# Patient Record
Sex: Female | Born: 1935 | Race: White | Hispanic: No | Smoking: Former smoker
Health system: Southern US, Community
[De-identification: ages and names within clinical notes are randomized; demographics above are authoritative.]

## PROBLEM LIST (undated history)

## (undated) DIAGNOSIS — I10 Essential (primary) hypertension: Secondary | ICD-10-CM

## (undated) DIAGNOSIS — E785 Hyperlipidemia, unspecified: Secondary | ICD-10-CM

## (undated) DIAGNOSIS — J439 Emphysema, unspecified: Secondary | ICD-10-CM

## (undated) DIAGNOSIS — I2510 Atherosclerotic heart disease of native coronary artery without angina pectoris: Secondary | ICD-10-CM

## (undated) HISTORY — DX: Essential (primary) hypertension: I10

## (undated) HISTORY — DX: Emphysema, unspecified: J43.9

## (undated) HISTORY — DX: Hyperlipidemia, unspecified: E78.5

## (undated) HISTORY — PX: CARDIAC CATHETERIZATION: SHX172

## (undated) HISTORY — DX: Atherosclerotic heart disease of native coronary artery without angina pectoris: I25.10

---

## 1982-09-01 HISTORY — PX: TOTAL ABDOMINAL HYSTERECTOMY W/ BILATERAL SALPINGOOPHORECTOMY: SHX83

## 2014-09-01 DIAGNOSIS — I219 Acute myocardial infarction, unspecified: Secondary | ICD-10-CM

## 2014-09-01 HISTORY — DX: Acute myocardial infarction, unspecified: I21.9

## 2020-05-24 ENCOUNTER — Telehealth: Payer: Self-pay | Admitting: Cardiovascular Disease

## 2020-05-24 NOTE — Telephone Encounter (Signed)
Encounter not needed

## 2020-05-28 ENCOUNTER — Telehealth: Payer: Self-pay | Admitting: Hematology and Oncology

## 2020-05-28 NOTE — Telephone Encounter (Signed)
Received a new hem referral from Dr. Jacalyn Lefevre at [facility] for polycythemia vera. Melissa Barron returned my call and has been scheduled to see Melissa Barron on 10/13 at 11:30am. Pt aware to arrive 20 minutes early.

## 2020-06-06 ENCOUNTER — Telehealth: Payer: Self-pay | Admitting: Hematology and Oncology

## 2020-06-06 NOTE — Telephone Encounter (Signed)
Melissa Barron cld to reschedule her new hem appt to 11/5 at 11:15am.

## 2020-06-08 ENCOUNTER — Ambulatory Visit: Payer: Self-pay | Admitting: Cardiovascular Disease

## 2020-06-13 ENCOUNTER — Encounter: Payer: Self-pay | Admitting: Hematology and Oncology

## 2020-06-15 ENCOUNTER — Ambulatory Visit: Payer: Self-pay | Admitting: Cardiology

## 2020-06-18 ENCOUNTER — Telehealth: Payer: Self-pay

## 2020-06-18 ENCOUNTER — Other Ambulatory Visit: Payer: Self-pay

## 2020-06-18 ENCOUNTER — Encounter: Payer: Self-pay | Admitting: Cardiology

## 2020-06-18 ENCOUNTER — Ambulatory Visit: Payer: Medicare HMO | Admitting: Cardiology

## 2020-06-18 VITALS — BP 135/59 | HR 57 | Resp 18 | Ht 60.0 in | Wt 124.0 lb

## 2020-06-18 DIAGNOSIS — I10 Essential (primary) hypertension: Secondary | ICD-10-CM | POA: Insufficient documentation

## 2020-06-18 DIAGNOSIS — Z8249 Family history of ischemic heart disease and other diseases of the circulatory system: Secondary | ICD-10-CM | POA: Insufficient documentation

## 2020-06-18 DIAGNOSIS — R0989 Other specified symptoms and signs involving the circulatory and respiratory systems: Secondary | ICD-10-CM

## 2020-06-18 DIAGNOSIS — I2510 Atherosclerotic heart disease of native coronary artery without angina pectoris: Secondary | ICD-10-CM

## 2020-06-18 MED ORDER — PANTOPRAZOLE SODIUM 40 MG PO TBEC: 40.0000 mg | DELAYED_RELEASE_TABLET | Freq: Every day | ORAL | 3 refills | Status: DC

## 2020-06-18 MED ORDER — CARVEDILOL 6.25 MG PO TABS: 6.2500 mg | ORAL_TABLET | Freq: Two times a day (BID) | ORAL | 3 refills | Status: DC

## 2020-06-18 MED ORDER — AMLODIPINE BESYLATE 5 MG PO TABS: 5.0000 mg | ORAL_TABLET | Freq: Every day | ORAL | 3 refills | Status: DC

## 2020-06-18 MED ORDER — ISOSORBIDE MONONITRATE ER 30 MG PO TB24: 30.0000 mg | ORAL_TABLET | Freq: Every day | ORAL | 3 refills | Status: DC

## 2020-06-18 MED ORDER — ASPIRIN EC 81 MG PO TBEC: 81.0000 mg | DELAYED_RELEASE_TABLET | Freq: Every day | ORAL | 3 refills | Status: AC

## 2020-06-18 MED ORDER — IRBESARTAN 300 MG PO TABS: 300.0000 mg | ORAL_TABLET | Freq: Every day | ORAL | 3 refills | Status: AC

## 2020-06-18 NOTE — Telephone Encounter (Signed)
Pt son called to inform us the pt does not have family history of AAA, and thinks that you might be confusing him with pt husband. Who was here a last week. Please advise

## 2020-06-18 NOTE — Progress Notes (Signed)
Your patient.

## 2020-06-18 NOTE — Telephone Encounter (Signed)
I am aware of husband's history, but I saw on the family history tab re: h/o AAA in mother. Probably entered wrong. Cancel aorta duplex.   Thanks MJP

## 2020-06-18 NOTE — Addendum Note (Signed)
Addended by: Nigel Mormon on: 06/18/2020 01:54 PM   Modules accepted: Orders, Level of Service

## 2020-06-18 NOTE — Progress Notes (Addendum)
Patient referred by [name], MD for coronary artery disease  Subjective:   Melissa Barron, female    DOB: 26-Jul-1931, 86 y.o.   MRN: [id number]   Chief Complaint  Patient presents with  . Coronary Artery Disease  . New Patient (Initial Visit)    Referred by Cristie Hem, MD     HPI  86 y.o. Caucasian female with hypertension, hyperlipidemia, CAD w/h/o MI (2018), COPD, depression, GERD.  Patient is here today with her son.  She moved from [place] to [place] recently.  She has history of MI and stents in 2016.  At that time, her symptoms were arm pain, shoulder pain, jaw pain and tingling in fingers.  She has not had the symptoms ever since successful PCI in 2016.  She has stable mild exertional dyspnea.  She has known COPD, and has had regular follow-up with pulmonologist in [place].  On a separate note, she has been on hydroxyurea for hematological condition, details not known to me.  MCV is noted to be elevated at 106.   Past Medical History:  Diagnosis Date  . Coronary artery disease   . Heart attack ([place]) 2016  . Hyperlipidemia   . Hypertension      Past Surgical History:  Procedure Laterality Date  . TOTAL ABDOMINAL HYSTERECTOMY W/ BILATERAL SALPINGOOPHORECTOMY  1984     Social History   Tobacco Use  Smoking Status Former Smoker  . Packs/day: 1.50  . Years: 30.00  . Pack years: 45.00  . Types: Cigarettes  . Quit date: 54  . Years since quitting: 41.8  Smokeless Tobacco Never Used    Social History   Substance and Sexual Activity  Alcohol Use Yes  . Alcohol/week: 1.0 standard drink  . Types: 1 Glasses of wine per week   Comment: 7 days a week/1 a day     Family History  Problem Relation Age of Onset  . AAA (abdominal aortic aneurysm) Mother   . Heart attack Mother   . Colon cancer Brother   . Parkinson's disease Sister   . Emphysema Sister      Current Outpatient Medications on File Prior to Visit  Medication Sig Dispense  Refill  . amLODipine (NORVASC) 5 MG tablet Take 1 tablet by mouth daily.    Marland Kitchen aspirin EC 81 MG tablet Take 81 mg by mouth daily. Swallow whole.    Marland Kitchen atorvastatin (LIPITOR) 80 MG tablet Take 1 tablet by mouth daily.    . carvedilol (COREG) 6.25 MG tablet Take 1 tablet by mouth 2 (two) times daily.    . ergocalciferol (VITAMIN D2) 1.25 MG (50000 UT) capsule Take 50,000 Units by mouth once a week.    . hydroxyurea (HYDREA) 500 MG capsule Take 1 capsule by mouth daily.    . irbesartan (AVAPRO) 300 MG tablet Take 1 tablet by mouth at bedtime.    . isosorbide mononitrate (IMDUR) 30 MG 24 hr tablet Take 1 tablet by mouth daily.    . pantoprazole (PROTONIX) 40 MG tablet Take 1 tablet by mouth daily.    . sertraline (ZOLOFT) 100 MG tablet Take 1 tablet by mouth daily.     No current facility-administered medications on file prior to visit.    Cardiovascular and other pertinent studies:  EKG 06/18/2020:  Sinus rhythm 61 bpm.   First-degree AV block.   Cannot exclude old septal infarct.   Poor R wave progression.     Recent labs: 05/12/2020: Glucose 80, BUN/Cr 17/0.7. EGFR  79. Na/K 137/4.1. Rest of the CMP normal H/H 14/44. MCV 106. Platelets 280 HbA1C N/A Chol 124, TG 106, HDL 42, LDL 61 TSH N/A    ROS       Vitals:   06/18/20 1048  BP: (!) 135/59  Pulse: (!) 57  Resp: 18  SpO2: 92%     Body mass index is 24.22 kg/m. Filed Weights   06/18/20 1048  Weight: 124 lb (56.2 kg)     Objective:   Physical Exam Vitals and nursing note reviewed.  Constitutional:      General: She is not in acute distress. Neck:     Vascular: No JVD.  Cardiovascular:     Rate and Rhythm: Normal rate and regular rhythm.     Pulses:          Carotid pulses are on the right side with bruit.    Heart sounds: Murmur heard.  Harsh midsystolic murmur is present with a grade of 3/6 at the upper right sternal border radiating to the neck.   Pulmonary:     Effort: Pulmonary effort is normal.      Breath sounds: Normal breath sounds. No wheezing or rales.         Assessment & Recommendations:   86 y.o. Caucasian female with hypertension, hyperlipidemia, CAD w/h/o MI (2018), COPD, depression, GERD.  CAD: History of MI and stents in 2016.  No angina symptoms. Continue aspirin, statin, carvedilol, amlodipine. Lipids well controlled.  Hypertension: Well-controlled on above medications, plus irbesartan.  Murmur: Suspect mild aortic stenosis.  Patient tells me that she was told to have mitral valve calcification.  Will obtain echocardiogram, and compared with previous echocardiograms performed in [place].  Carotid bruit: No symptoms.  Will obtain carotid ultrasound for baseline evaluation.   Follow-up in 1 year.   Thank you for referring the patient to [place]. Please feel free to contact with any questions.   Nigel Mormon, MD Pager: [phone] Office: [phone]

## 2020-06-21 ENCOUNTER — Other Ambulatory Visit: Payer: Medicare HMO

## 2020-06-26 ENCOUNTER — Ambulatory Visit: Payer: Medicare HMO

## 2020-06-26 ENCOUNTER — Other Ambulatory Visit: Payer: Self-pay

## 2020-06-26 DIAGNOSIS — I2510 Atherosclerotic heart disease of native coronary artery without angina pectoris: Secondary | ICD-10-CM

## 2020-06-26 DIAGNOSIS — I6523 Occlusion and stenosis of bilateral carotid arteries: Secondary | ICD-10-CM

## 2020-06-26 DIAGNOSIS — R0989 Other specified symptoms and signs involving the circulatory and respiratory systems: Secondary | ICD-10-CM

## 2020-06-27 NOTE — Progress Notes (Signed)
Unable to reach patient. Left vm to cb.

## 2020-06-28 ENCOUNTER — Telehealth: Payer: Self-pay

## 2020-06-28 NOTE — Telephone Encounter (Signed)
Left detailed message regarding results and medication therapy. Advised patient to call office for any questions or concerns.

## 2020-06-28 NOTE — Telephone Encounter (Signed)
-----  Message from Elyn Peers, [place] sent at 06/27/2020  1:51 PM EDT ----- Unable to reach patient. Left vm to cb.

## 2020-07-05 NOTE — Progress Notes (Signed)
[facility] CONSULT NOTE  Patient Care Team: [name], MD as PCP - General (Internal Medicine)  CHIEF COMPLAINTS/PURPOSE OF CONSULTATION:  History of polycythemia vera   HISTORY OF PRESENTING ILLNESS:  Melissa Barron 86 y.o. female is here because of a history of polycythemia vera for which she is currently on hydroxyurea. She is referred by Dr. Jacalyn Lefevre at [facility] on 05/11/20 showed: Hg 14.3, HCT 44.4, MCV 106.8, MCH 34.5, platelets 260. She presents to the clinic today for initial evaluation. She was diagnosed with polycythemia vera about 6 years ago. She was on hydroxyurea 5 days a week and then recently increased to 7 days a week. She was being seen at [facility] every 6 months with labs. She has not had any side effects to hydroxyurea. She moved to [facility] to be closer to her son.  I reviewed her records extensively and collaborated the history with the patient.  MEDICAL HISTORY:  Past Medical History:  Diagnosis Date   Coronary artery disease    Emphysema, unspecified ([place])    Both   Heart attack ([place]) 2016   Hyperlipidemia    Hypertension     SURGICAL HISTORY: Past Surgical History:  Procedure Laterality Date   TOTAL ABDOMINAL HYSTERECTOMY W/ BILATERAL SALPINGOOPHORECTOMY  1984    SOCIAL HISTORY: Social History   Socioeconomic History   Marital status: Married    Spouse name: Not on file   Number of children: 4   Years of education: Not on file   Highest education level: Not on file  Occupational History   Not on file  Tobacco Use   Smoking status: Former Smoker    Packs/day: 1.50    Years: 30.00    Pack years: 45.00    Types: Cigarettes    Quit date: 1980    Years since quitting: 41.8   Smokeless tobacco: Never Used  Scientific laboratory technician Use: Never used  Substance and Sexual Activity   Alcohol use: Yes    Alcohol/week: 1.0 standard drink    Types: 1 Glasses of wine per week    Comment: 7 days a week/1 a day    Drug use: Never   Sexual activity: Not on file  Other Topics Concern   Not on file  Social History Narrative   Not on file   Social Determinants of Health   Financial Resource Strain:    Difficulty of Paying Living Expenses: Not on file  Food Insecurity:    Worried About [place] in the Last Year: Not on file   Ran Out of Food in the Last Year: Not on file  Transportation Needs:    Lack of Transportation (Medical): Not on file   Lack of Transportation (Non-Medical): Not on file  Physical Activity:    Days of Exercise per Week: Not on file   Minutes of Exercise per Session: Not on file  Stress:    Feeling of Stress : Not on file  Social Connections:    Frequency of Communication with Friends and Family: Not on file   Frequency of Social Gatherings with Friends and Family: Not on file   Attends Religious Services: Not on file   Active Member of Clubs or Organizations: Not on file   Attends Archivist Meetings: Not on file   Marital Status: Not on file  Intimate Partner Violence:    Fear of Current or Ex-Partner: Not on file   Emotionally Abused: Not on file  Physically Abused: Not on file   Sexually Abused: Not on file    FAMILY HISTORY: Family History  Problem Relation Age of Onset   Heart attack Mother    Colon cancer Brother    Parkinson's disease Sister    Emphysema Sister     ALLERGIES:  has No Known Allergies.  MEDICATIONS:  Current Outpatient Medications  Medication Sig Dispense Refill   amLODipine (NORVASC) 5 MG tablet Take 1 tablet (5 mg total) by mouth daily. 90 tablet 3   aspirin EC 81 MG tablet Take 1 tablet (81 mg total) by mouth daily. Swallow whole. 90 tablet 3   atorvastatin (LIPITOR) 80 MG tablet Take 1 tablet by mouth daily.     carvedilol (COREG) 6.25 MG tablet Take 1 tablet (6.25 mg total) by mouth 2 (two) times daily. 180 tablet 3   Cholecalciferol (VITAMIN D3) 250 MCG (10000 UT) capsule  Take 1 capsule (10,000 Units total) by mouth daily.     hydroxyurea (HYDREA) 500 MG capsule Take 1 capsule (500 mg total) by mouth daily. 90 capsule 3   irbesartan (AVAPRO) 300 MG tablet Take 1 tablet (300 mg total) by mouth at bedtime. 90 tablet 3   isosorbide mononitrate (IMDUR) 30 MG 24 hr tablet Take 1 tablet (30 mg total) by mouth daily. 90 tablet 3   pantoprazole (PROTONIX) 40 MG tablet Take 1 tablet (40 mg total) by mouth daily. 90 tablet 3   sertraline (ZOLOFT) 100 MG tablet Take 1 tablet by mouth daily.     No current facility-administered medications for this visit.    REVIEW OF SYSTEMS:      PHYSICAL EXAMINATION: ECOG PERFORMANCE STATUS: 1 - Symptomatic but completely ambulatory  Vitals:   07/06/20 1157  BP: (!) 145/53  Pulse: 66  Resp: 18  Temp: (!) 97.1 F (36.2 C)  SpO2: 90%   Filed Weights   07/06/20 1157  Weight: 122 lb 1.6 oz (55.4 kg)       LABORATORY DATA:  I have reviewed the data as listed No results found for: WBC, HGB, HCT, MCV, PLT No results found for: NA, K, CL, CO2  RADIOGRAPHIC STUDIES: I have personally reviewed the radiological reports and agreed with the findings in the report.  ASSESSMENT AND PLAN:  Polycythemia vera ([place]) Diagnosed at [facility] Current treatment: Hydroxyurea 500 mg daily History of COPD, depression, hypertension, hypercholesterolemia, osteoporosis, GERD, CAD Hydroxyurea toxicities: None: She is tolerating it extremely well.  Lab review: 05/11/2020: Hemoglobin 14.3, WBC 7.9, MCV 106.8, platelets 260 The macrocytosis indicates that the patient is compliant on hydroxyurea.  I agree with continuing the current treatment. Return to clinic every 6 months with labs and follow-up.   All questions were answered. The patient knows to call the clinic with any problems, questions or concerns.   Rulon Eisenmenger, MD, MPH 07/06/2020    I, Molly Dorshimer, am acting as scribe for Nicholas Lose, MD.  I have reviewed the  above documentation for accuracy and completeness, and I agree with the above.

## 2020-07-06 ENCOUNTER — Other Ambulatory Visit: Payer: Self-pay | Admitting: Cardiology

## 2020-07-06 ENCOUNTER — Other Ambulatory Visit: Payer: Self-pay

## 2020-07-06 ENCOUNTER — Inpatient Hospital Stay: Payer: Medicare HMO | Attending: Hematology and Oncology | Admitting: Hematology and Oncology

## 2020-07-06 DIAGNOSIS — D45 Polycythemia vera: Secondary | ICD-10-CM

## 2020-07-06 DIAGNOSIS — I351 Nonrheumatic aortic (valve) insufficiency: Secondary | ICD-10-CM

## 2020-07-06 DIAGNOSIS — I2510 Atherosclerotic heart disease of native coronary artery without angina pectoris: Secondary | ICD-10-CM | POA: Insufficient documentation

## 2020-07-06 DIAGNOSIS — M810 Age-related osteoporosis without current pathological fracture: Secondary | ICD-10-CM | POA: Diagnosis not present

## 2020-07-06 DIAGNOSIS — F329 Major depressive disorder, single episode, unspecified: Secondary | ICD-10-CM | POA: Diagnosis not present

## 2020-07-06 DIAGNOSIS — K219 Gastro-esophageal reflux disease without esophagitis: Secondary | ICD-10-CM | POA: Insufficient documentation

## 2020-07-06 DIAGNOSIS — I361 Nonrheumatic tricuspid (valve) insufficiency: Secondary | ICD-10-CM

## 2020-07-06 DIAGNOSIS — J439 Emphysema, unspecified: Secondary | ICD-10-CM | POA: Diagnosis not present

## 2020-07-06 DIAGNOSIS — Z87891 Personal history of nicotine dependence: Secondary | ICD-10-CM | POA: Insufficient documentation

## 2020-07-06 DIAGNOSIS — I252 Old myocardial infarction: Secondary | ICD-10-CM | POA: Insufficient documentation

## 2020-07-06 DIAGNOSIS — E785 Hyperlipidemia, unspecified: Secondary | ICD-10-CM | POA: Insufficient documentation

## 2020-07-06 DIAGNOSIS — Z79899 Other long term (current) drug therapy: Secondary | ICD-10-CM | POA: Diagnosis not present

## 2020-07-06 DIAGNOSIS — I350 Nonrheumatic aortic (valve) stenosis: Secondary | ICD-10-CM

## 2020-07-06 DIAGNOSIS — E7800 Pure hypercholesterolemia, unspecified: Secondary | ICD-10-CM | POA: Diagnosis not present

## 2020-07-06 DIAGNOSIS — Z7982 Long term (current) use of aspirin: Secondary | ICD-10-CM | POA: Diagnosis not present

## 2020-07-06 DIAGNOSIS — I340 Nonrheumatic mitral (valve) insufficiency: Secondary | ICD-10-CM

## 2020-07-06 DIAGNOSIS — Z800 Family history of malignant neoplasm of digestive organs: Secondary | ICD-10-CM | POA: Diagnosis not present

## 2020-07-06 DIAGNOSIS — I10 Essential (primary) hypertension: Secondary | ICD-10-CM | POA: Diagnosis not present

## 2020-07-06 MED ORDER — HYDROXYUREA 500 MG PO CAPS
500.0000 mg | ORAL_CAPSULE | Freq: Every day | ORAL | 3 refills | Status: DC
Start: 2020-07-06 — End: 2021-01-03

## 2020-07-06 MED ORDER — VITAMIN D3 250 MCG (10000 UT) PO CAPS: 10000.0000 [IU] | ORAL_CAPSULE | Freq: Every day | ORAL | Status: AC

## 2020-07-06 NOTE — Assessment & Plan Note (Signed)
Diagnosed at [facility] Current treatment: Hydroxyurea 500 mg daily History of COPD, depression, hypertension, hypercholesterolemia, osteoporosis, GERD, CAD Hydroxyurea toxicities:  Lab review: 05/11/2020: Hemoglobin 14.3, WBC 7.9, MCV 106.8, platelets 260 The macrocytosis indicates that the patient is compliant on hydroxyurea.  I agree with continuing the current treatment. Return to clinic every 3 months with labs and follow-up.

## 2020-07-13 NOTE — Progress Notes (Signed)
Called pt no answer. Left vm requesting call back. Will try again later. AD/S

## 2020-07-16 NOTE — Progress Notes (Signed)
Pt called back, spoke to her about echocardiogram results. AD/S

## 2020-07-16 NOTE — Progress Notes (Signed)
Called pt no answer. Left vm requesting call back. AD/S

## 2020-09-10 ENCOUNTER — Other Ambulatory Visit (HOSPITAL_COMMUNITY): Payer: Self-pay | Admitting: *Deleted

## 2020-09-12 ENCOUNTER — Other Ambulatory Visit: Payer: Self-pay

## 2020-09-12 ENCOUNTER — Ambulatory Visit (HOSPITAL_COMMUNITY)
Admission: RE | Admit: 2020-09-12 | Discharge: 2020-09-12 | Disposition: A | Discharge status: Home / Self Care | Payer: Medicare HMO | Source: Ambulatory Visit | Attending: Internal Medicine | Admitting: Internal Medicine

## 2020-09-12 DIAGNOSIS — M810 Age-related osteoporosis without current pathological fracture: Secondary | ICD-10-CM | POA: Diagnosis present

## 2020-09-12 MED ORDER — DENOSUMAB 60 MG/ML ~~LOC~~ SOSY
PREFILLED_SYRINGE | SUBCUTANEOUS | Status: AC
  Filled 2020-09-12: qty 1

## 2020-09-12 MED ORDER — DENOSUMAB 60 MG/ML ~~LOC~~ SOSY
60.0000 mg | PREFILLED_SYRINGE | Freq: Once | SUBCUTANEOUS | Status: AC
  Administered 2020-09-12: 60 mg via SUBCUTANEOUS

## 2020-09-12 NOTE — Discharge Instructions (Signed)
Denosumab injection °What is this medicine? °DENOSUMAB (den oh sue mab) slows bone breakdown. Prolia is used to treat osteoporosis in women after menopause and in men, and in people who are taking corticosteroids for 6 months or more. Xgeva is used to treat a high calcium level due to cancer and to prevent bone fractures and other bone problems caused by multiple myeloma or cancer bone metastases. Xgeva is also used to treat giant cell tumor of the bone. °This medicine may be used for other purposes; ask your health care provider or pharmacist if you have questions. °COMMON BRAND NAME(S): Prolia, XGEVA °What should I tell my health care provider before I take this medicine? °They need to know if you have any of these conditions: °· dental disease °· having surgery or tooth extraction °· infection °· kidney disease °· low levels of calcium or Vitamin D in the blood °· malnutrition °· on hemodialysis °· skin conditions or sensitivity °· thyroid or parathyroid disease °· an unusual reaction to denosumab, other medicines, foods, dyes, or preservatives °· pregnant or trying to get pregnant °· breast-feeding °How should I use this medicine? °This medicine is for injection under the skin. It is given by a health care professional in a hospital or clinic setting. °A special MedGuide will be given to you before each treatment. Be sure to read this information carefully each time. °For Prolia, talk to your pediatrician regarding the use of this medicine in children. Special care may be needed. For Xgeva, talk to your pediatrician regarding the use of this medicine in children. While this drug may be prescribed for children as young as 13 years for selected conditions, precautions do apply. °Overdosage: If you think you have taken too much of this medicine contact a poison control center or emergency room at once. °NOTE: This medicine is only for you. Do not share this medicine with others. °What if I miss a dose? °It is  important not to miss your dose. Call your doctor or health care professional if you are unable to keep an appointment. °What may interact with this medicine? °Do not take this medicine with any of the following medications: °· other medicines containing denosumab °This medicine may also interact with the following medications: °· medicines that lower your chance of fighting infection °· steroid medicines like prednisone or cortisone °This list may not describe all possible interactions. Give your health care provider a list of all the medicines, herbs, non-prescription drugs, or dietary supplements you use. Also tell them if you smoke, drink alcohol, or use illegal drugs. Some items may interact with your medicine. °What should I watch for while using this medicine? °Visit your doctor or health care professional for regular checks on your progress. Your doctor or health care professional may order blood tests and other tests to see how you are doing. °Call your doctor or health care professional for advice if you get a fever, chills or sore throat, or other symptoms of a cold or flu. Do not treat yourself. This drug may decrease your body's ability to fight infection. Try to avoid being around people who are sick. °You should make sure you get enough calcium and vitamin D while you are taking this medicine, unless your doctor tells you not to. Discuss the foods you eat and the vitamins you take with your health care professional. °See your dentist regularly. Brush and floss your teeth as directed. Before you have any dental work done, tell your dentist you are   receiving this medicine. Do not become pregnant while taking this medicine or for 5 months after stopping it. Talk with your doctor or health care professional about your birth control options while taking this medicine. Women should inform their doctor if they wish to become pregnant or think they might be pregnant. There is a potential for serious side  effects to an unborn child. Talk to your health care professional or pharmacist for more information. What side effects may I notice from receiving this medicine? Side effects that you should report to your doctor or health care professional as soon as possible:  allergic reactions like skin rash, itching or hives, swelling of the face, lips, or tongue  bone pain  breathing problems  dizziness  jaw pain, especially after dental work  redness, blistering, peeling of the skin  signs and symptoms of infection like fever or chills; cough; sore throat; pain or trouble passing urine  signs of low calcium like fast heartbeat, muscle cramps or muscle pain; pain, tingling, numbness in the hands or feet; seizures  unusual bleeding or bruising  unusually weak or tired Side effects that usually do not require medical attention (report to your doctor or health care professional if they continue or are bothersome):  constipation  diarrhea  headache  joint pain  loss of appetite  muscle pain  runny nose  tiredness  upset stomach This list may not describe all possible side effects. Call your doctor for medical advice about side effects. You may report side effects to FDA at 1-800-FDA-1088. Where should I keep my medicine? This medicine is only given in a clinic, doctor's office, or other health care setting and will not be stored at home. NOTE: This sheet is a summary. It may not cover all possible information. If you have questions about this medicine, talk to your doctor, pharmacist, or health care provider.  2021 Elsevier/Gold Standard (2017-12-25 16:10:44)

## 2020-11-01 DIAGNOSIS — M810 Age-related osteoporosis without current pathological fracture: Secondary | ICD-10-CM | POA: Diagnosis not present

## 2020-11-01 DIAGNOSIS — E7800 Pure hypercholesterolemia, unspecified: Secondary | ICD-10-CM | POA: Diagnosis not present

## 2020-11-08 DIAGNOSIS — Z1331 Encounter for screening for depression: Secondary | ICD-10-CM | POA: Diagnosis not present

## 2020-11-08 DIAGNOSIS — I110 Hypertensive heart disease with heart failure: Secondary | ICD-10-CM | POA: Diagnosis not present

## 2020-11-08 DIAGNOSIS — Z0000 Encounter for general adult medical examination without abnormal findings: Secondary | ICD-10-CM | POA: Diagnosis not present

## 2020-11-08 DIAGNOSIS — F418 Other specified anxiety disorders: Secondary | ICD-10-CM | POA: Diagnosis not present

## 2020-11-08 DIAGNOSIS — J9611 Chronic respiratory failure with hypoxia: Secondary | ICD-10-CM | POA: Diagnosis not present

## 2020-11-08 DIAGNOSIS — D45 Polycythemia vera: Secondary | ICD-10-CM | POA: Diagnosis not present

## 2020-11-08 DIAGNOSIS — Z1339 Encounter for screening examination for other mental health and behavioral disorders: Secondary | ICD-10-CM | POA: Diagnosis not present

## 2020-11-08 DIAGNOSIS — J449 Chronic obstructive pulmonary disease, unspecified: Secondary | ICD-10-CM | POA: Diagnosis not present

## 2020-11-08 DIAGNOSIS — I272 Pulmonary hypertension, unspecified: Secondary | ICD-10-CM | POA: Diagnosis not present

## 2020-11-08 DIAGNOSIS — I2510 Atherosclerotic heart disease of native coronary artery without angina pectoris: Secondary | ICD-10-CM | POA: Diagnosis not present

## 2020-11-08 DIAGNOSIS — I5032 Chronic diastolic (congestive) heart failure: Secondary | ICD-10-CM | POA: Diagnosis not present

## 2020-11-11 DIAGNOSIS — J449 Chronic obstructive pulmonary disease, unspecified: Secondary | ICD-10-CM | POA: Diagnosis not present

## 2020-11-20 DIAGNOSIS — J449 Chronic obstructive pulmonary disease, unspecified: Secondary | ICD-10-CM | POA: Diagnosis not present

## 2020-12-04 ENCOUNTER — Other Ambulatory Visit: Payer: Self-pay

## 2020-12-04 ENCOUNTER — Ambulatory Visit: Payer: Medicare HMO | Admitting: Pulmonary Disease

## 2020-12-04 ENCOUNTER — Encounter: Payer: Self-pay | Admitting: Pulmonary Disease

## 2020-12-04 VITALS — BP 116/70 | HR 74 | Temp 98.1°F | Ht 60.0 in | Wt 118.0 lb

## 2020-12-04 DIAGNOSIS — J9611 Chronic respiratory failure with hypoxia: Secondary | ICD-10-CM

## 2020-12-04 DIAGNOSIS — R0600 Dyspnea, unspecified: Secondary | ICD-10-CM

## 2020-12-04 DIAGNOSIS — R0609 Other forms of dyspnea: Secondary | ICD-10-CM

## 2020-12-04 NOTE — Progress Notes (Signed)
_0  ID: Melissa Barron, female    DOB: 12/05/1930, 87 y.o.   MRN: [id number]  Chief Complaint  Patient presents with  . Consult    Referred by PCP for history for COPD. States she has noticed an increase in SOB for the past few months. Was last seen by Dr. Ashok Cordia in [place].     Referring provider: [name], MD  HPI:   87 year old whom we are seeing in consultation for evaluation of chronic hypoxemic respiratory failure.  Most recent PCP note reviewed.  Pulmonary note x2 reviewed from [facility] system, most recently 09/2019.  Patient recently moved from [place] to the [place] area to be closer to her son.  He is retired.  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  Overall, she has chronic dyspnea.  Has been relatively stable for some years but now worse over the last 2 or 3 months.  This in the setting of not using her Spiriva.  Sound like he got the donut hole and got expensive.  She stopped using it for some time.  She recently got a new prescription of this the last week or 2 to start using again.  Her dyspnea gets better with rest.  Worse on inclines or stairs.  No clear changes in symptoms with seasons.  No change in symptoms with environment as she moved to the area and had been in her current living situation for 2 or 3 months prior to onset of worsening symptoms.  No time during day were things are better or worse.  In the past [facility] has been very helpful.  No other alleviating or exacerbating factors.  Reviewed prior images which seem to indicate emphysema on CT scan.  There is some discussion of mild fibrosis that the patient attributes to prior pneumonias.  Not much discussion in prior pulmonary notes.  Reviewed serial PFTs which showed normal spirometry with markedly reduced or severely reduced DLCO in the 40-50% range on serial readings out of portion to spirometry findings.   PMH: Chronic hypoxemic respiratory failure, aortic regurg, aortic  stenosis, mitral regurg, polycythemia vera, GERD, hypertension Surgical history: Hysterectomy Family history: Mother with CAD, sister with emphysema Social history: Former smoker, 40-50-pack-year smoking history, lives with husband   Licensed conveyancer / Pulmonary Flowsheets:   ACT:  No flowsheet data found.  MMRC: mMRC Dyspnea Scale mMRC Score  12/04/2020 2    [place]:  No flowsheet data found.  Tests:   FENO:  No results found for: NITRICOXIDE  PFT: Personally reviewed results from 2019 through 09/2019 all with similar results that demonstrate on my interpretation normal spirometry without fixed obstruction, severely reduced DLCO.  WALK:  SIX MIN WALK 12/04/2020  Supplimental Oxygen during Test? (L/min) Yes  O2 Flow Rate 2    Imaging: Personally reviewed results of this discussion above  Lab Results: Personally reviewed and as per EMR via telehealth and care everywhere CBC No results found for: WBC, RBC, HGB, HCT, PLT, MCV, MCH, MCHC, RDW, LYMPHSABS, MONOABS, EOSABS, BASOSABS  BMET No results found for: NA, K, CL, CO2, GLUCOSE, BUN, CREATININE, CALCIUM, GFRNONAA, GFRAA  BNP No results found for: BNP  ProBNP No results found for: PROBNP  Specialty Problems   None     No Known Allergies  Immunization History  Administered Date(s) Administered  . Influenza, Quadrivalent, Recombinant, Inj, Pf 05/11/2020  . PFIZER(Purple Top)SARS-COV-2 Vaccination 09/18/2019, 10/09/2019, 07/27/2020  . Pneumococcal Conjugate-13 06/01/2015    Past Medical History:  Diagnosis  Date  . Coronary artery disease   . Emphysema, unspecified ([place])    Both  . Heart attack ([place]) 2016  . Hyperlipidemia   . Hypertension     Tobacco History: Social History   Tobacco Use  Smoking Status Former Smoker  . Packs/day: 1.50  . Years: 30.00  . Pack years: 45.00  . Types: Cigarettes  . Quit date: 38  . Years since quitting: 42.2  Smokeless Tobacco Never Used   Counseling given: Not  Answered   Continue to not smoke  Outpatient Encounter Medications as of 12/04/2020  Medication Sig  . amLODipine (NORVASC) 5 MG tablet Take 1 tablet (5 mg total) by mouth daily.  Marland Kitchen aspirin EC 81 MG tablet Take 1 tablet (81 mg total) by mouth daily. Swallow whole.  Marland Kitchen atorvastatin (LIPITOR) 80 MG tablet Take 1 tablet by mouth daily.  . carvedilol (COREG) 6.25 MG tablet Take 1 tablet (6.25 mg total) by mouth 2 (two) times daily.  . Cholecalciferol (VITAMIN D3) 250 MCG (10000 UT) capsule Take 1 capsule (10,000 Units total) by mouth daily.  . hydroxyurea (HYDREA) 500 MG capsule Take 1 capsule (500 mg total) by mouth daily.  . irbesartan (AVAPRO) 300 MG tablet Take 1 tablet (300 mg total) by mouth at bedtime.  . isosorbide mononitrate (IMDUR) 30 MG 24 hr tablet Take 1 tablet (30 mg total) by mouth daily.  . pantoprazole (PROTONIX) 40 MG tablet Take 1 tablet (40 mg total) by mouth daily.  . sertraline (ZOLOFT) 100 MG tablet Take 1 tablet by mouth daily.  . Tiotropium Bromide Monohydrate (SPIRIVA RESPIMAT) 2.5 MCG/ACT AERS Inhale 2 puffs into the lungs daily.   No facility-administered encounter medications on file as of 12/04/2020.     Review of Systems  Review of Systems  She denies chest pain with exertion, no orthopnea or PND.  Comprehensive review of systems otherwise negative Physical Exam  BP 116/70   Pulse 74   Temp 98.1 F (36.7 C) (Temporal)   Ht 5' (1.524 m)   Wt 118 lb (53.5 kg)   SpO2 (!) 84% Comment: on RA  BMI 23.05 kg/m   Wt Readings from Last 5 Encounters:  12/04/20 118 lb (53.5 kg)  07/06/20 122 lb 1.6 oz (55.4 kg)  06/18/20 124 lb (56.2 kg)    BMI Readings from Last 5 Encounters:  12/04/20 23.05 kg/m  07/06/20 23.85 kg/m  06/18/20 24.22 kg/m     Physical Exam General: Elderly, frail, no acute distress Eyes: EOMI, no icterus Neck: Supple, no JVP Cardiovascular: Regular rhythm, no murmur Pulmonary: Clear oxygen bilaterally, no wheeze or  crackle Abdomen: Nondistended, bowel sounds present MSK: No synovitis, no joint effusion Neuro: No weakness, sensation appears intact Psych: Normal mood, full affect   Assessment & Plan:   Chronic Hypoxemic Respiratory Failure: Likely multifactorial related to emphysema, pulmonary vascular disease, report of scar from prior pneumonia on CT scan, likely component of mild cardiogenic pulmonary edema given dilated LA, aortic stenosis, aortic regurg, mitral valve regurg, diastolic dysfunction. DLCO is out of proportion to prior spirometry which is normal which indicates an extrapulmonary contribution. Counseled to use O2 via [place] at all times - not wearing when outside of house. --Consider repeat PFTs, CT scan in future although I doubt there is much more therapeutic option to offer other than escalating inhaler therapy  Emphysema: Due to prior cigarette smoking.  Contributed to dyspnea exertion, hypoxemia.  To resume Spiriva and assess response.  Consider escalating inhaler therapy in  future.   Return in about 3 months (around 03/05/2021).   Lanier Clam, MD 12/04/2020

## 2020-12-04 NOTE — Patient Instructions (Signed)
It is nice to meet you  Please use the Spiriva every day as prescribed.  I hope this will help improve your breathing that has seemed to struggle over the last couple months.  If your breathing is not improving at next visit, we can consider repeating breathing tests, increasing or changing the inhaler.  Your oxygen saturation today was in the 80s on room air.  I highly recommend use oxygen at all times, 2 L, to keep her oxygen saturation above 88%.  I understand he did not like wearing it when you are out of the house, ultimately it is your decision up to you.  Return to clinic for follow-up with Dr. Silas Flood in 3 months or sooner as needed

## 2020-12-12 DIAGNOSIS — J449 Chronic obstructive pulmonary disease, unspecified: Secondary | ICD-10-CM | POA: Diagnosis not present

## 2020-12-21 DIAGNOSIS — J449 Chronic obstructive pulmonary disease, unspecified: Secondary | ICD-10-CM | POA: Diagnosis not present

## 2020-12-26 ENCOUNTER — Other Ambulatory Visit: Payer: Self-pay

## 2020-12-26 ENCOUNTER — Ambulatory Visit: Payer: Medicare HMO | Admitting: Cardiology

## 2020-12-26 ENCOUNTER — Encounter: Payer: Self-pay | Admitting: Cardiology

## 2020-12-26 VITALS — BP 171/61 | HR 67 | Temp 97.0°F | Resp 17 | Ht 60.0 in | Wt 113.0 lb

## 2020-12-26 DIAGNOSIS — R0609 Other forms of dyspnea: Secondary | ICD-10-CM

## 2020-12-26 DIAGNOSIS — I350 Nonrheumatic aortic (valve) stenosis: Secondary | ICD-10-CM | POA: Diagnosis not present

## 2020-12-26 DIAGNOSIS — I25118 Atherosclerotic heart disease of native coronary artery with other forms of angina pectoris: Secondary | ICD-10-CM

## 2020-12-26 DIAGNOSIS — R0600 Dyspnea, unspecified: Secondary | ICD-10-CM

## 2020-12-26 DIAGNOSIS — I10 Essential (primary) hypertension: Secondary | ICD-10-CM | POA: Diagnosis not present

## 2020-12-26 MED ORDER — CARVEDILOL 6.25 MG PO TABS
12.5000 mg | ORAL_TABLET | Freq: Two times a day (BID) | ORAL | 0 refills | Status: DC
Start: 2020-12-26 — End: 2021-01-03

## 2020-12-26 NOTE — Progress Notes (Signed)
Patient referred by [name], MD for coronary artery disease  Subjective:   Melissa Barron, female    DOB: 26-May-1931, 87 y.o.   MRN: [id number]   Chief Complaint  Patient presents with  . Chest Pain  . Shortness of Breath  . Follow-up  . Hypertension  . Coronary Artery Disease     HPI  87 y.o. Caucasian female with hypertension, hyperlipidemia, CAD w/h/o MI (2018), COPD, depression, GERD.  Patient was last seen by me in 06/2020.  Since March 2022, patient has developed exertional dyspnea with minimal activity.  She denies any chest pain, jaw pain, arm pain.  She has not noticed any significant orthopnea, PND, leg edema symptoms.  Blood pressure is elevated today.   Initial consultation 06/2020: Patient is here today with her son.  She moved from [place] to [place] recently.  She has history of MI and stents in 2016.  At that time, her symptoms were arm pain, shoulder pain, jaw pain and tingling in fingers.  She has not had the symptoms ever since successful PCI in 2016.  She has stable mild exertional dyspnea.  She has known COPD, and has had regular follow-up with pulmonologist in [place].  On a separate note, she has been on hydroxyurea for hematological condition, details not known to me.  MCV is noted to be elevated at 106.    Current Outpatient Medications on File Prior to Visit  Medication Sig Dispense Refill  . amLODipine (NORVASC) 5 MG tablet Take 1 tablet (5 mg total) by mouth daily. 90 tablet 3  . aspirin EC 81 MG tablet Take 1 tablet (81 mg total) by mouth daily. Swallow whole. 90 tablet 3  . atorvastatin (LIPITOR) 80 MG tablet Take 1 tablet by mouth daily.    . carvedilol (COREG) 6.25 MG tablet Take 1 tablet (6.25 mg total) by mouth 2 (two) times daily. 180 tablet 3  . Cholecalciferol (VITAMIN D3) 250 MCG (10000 UT) capsule Take 1 capsule (10,000 Units total) by mouth daily.    . hydroxyurea (HYDREA) 500 MG capsule Take 1 capsule (500 mg total) by  mouth daily. 90 capsule 3  . irbesartan (AVAPRO) 300 MG tablet Take 1 tablet (300 mg total) by mouth at bedtime. 90 tablet 3  . isosorbide mononitrate (IMDUR) 30 MG 24 hr tablet Take 1 tablet (30 mg total) by mouth daily. 90 tablet 3  . pantoprazole (PROTONIX) 40 MG tablet Take 1 tablet (40 mg total) by mouth daily. 90 tablet 3  . sertraline (ZOLOFT) 100 MG tablet Take 1 tablet by mouth daily.    . Tiotropium Bromide Monohydrate (SPIRIVA RESPIMAT) 2.5 MCG/ACT AERS Inhale 2 puffs into the lungs daily.     No current facility-administered medications on file prior to visit.    Cardiovascular and other pertinent studies:  EKG 12/26/2020: Sinus rhythm 65 bpm First degree A-V block  Anterior infarct -age undetermined, new since 06/2020 Inferolateral T wave inversion, consider ischemia, new since 06/2020  EKG 06/18/2020:  Sinus rhythm 61 bpm.   First-degree AV block.   Cannot exclude old septal infarct.   Poor R wave progression.    Echocardiogram 06/26/2020:  Left ventricle cavity is normal in size. Moderate concentric hypertrophy  of the left ventricle. Normal global wall motion. Normal LV systolic  function with EF 55%. Doppler evidence of grade I (impaired) diastolic  dysfunction, normal LAP. Calculated EF 55%.  Left atrial cavity is moderately dilated. Aneurysmal interatrial septum  without 2D or color  Doppler evidence of shunting.  Aortic valve is probably bileaflet with mild calcificaion. Moderate aortic  stenosis. Vmax 3.1 m/sec, mean PG 25 mmHg, AVA 0.8 cm2, dimensionless  index 0.36. Moderate (Grade II) aortic regurgitation.Mild mitral valve  leaflet calcification. Mildly restricted mitral valve leaflets. Moderate  (Grade II) mitral regurgitation.  Moderate tricuspid regurgitation. Estimated pulmonary artery systolic  pressure 39 mmHg.   Carotid artery duplex 06/26/2020:  Doppler velocity suggests stenosis in the right internal carotid artery  (16-49%). Stenosis in the  right external carotid artery (<50%).  Doppler velocity suggests stenosis in the left internal carotid artery  (50-69%). Stenosis in the left external carotid artery (<50%).  Antegrade right vertebral artery flow. Antegrade left vertebral artery  flow.  Follow up in six months is appropriate if clinically indicated.  Recent labs: 05/12/2020: Glucose 80, BUN/Cr 17/0.7. EGFR 79. Na/K 137/4.1. Rest of the CMP normal H/H 14/44. MCV 106. Platelets 280 HbA1C N/A Chol 124, TG 106, HDL 42, LDL 61 TSH N/A    Review of Systems  Cardiovascular: Positive for dyspnea on exertion. Negative for chest pain, leg swelling, palpitations and syncope.  Respiratory: Positive for shortness of breath.          Vitals:   12/26/20 1108 12/26/20 1110  BP: (!) 173/65 (!) 171/61  Pulse: 87 67  Resp: 17   Temp: (!) 97 F (36.1 C)   SpO2: 97%      Body mass index is 22.07 kg/m. Filed Weights   12/26/20 1108  Weight: 113 lb (51.3 kg)     Objective:   Physical Exam Vitals and nursing note reviewed.  Constitutional:      General: She is not in acute distress. Neck:     Vascular: No JVD.  Cardiovascular:     Rate and Rhythm: Normal rate and regular rhythm.     Pulses:          Carotid pulses are on the right side with bruit.    Heart sounds: Murmur heard.   Harsh midsystolic murmur is present with a grade of 3/6 at the upper right sternal border radiating to the neck.   Pulmonary:     Effort: Pulmonary effort is normal.     Breath sounds: Normal breath sounds. No wheezing or rales.  Musculoskeletal:     Right lower leg: No edema.         Assessment & Recommendations:   87 y.o. Caucasian female with hypertension, hyperlipidemia, CAD w/h/o MI (2018), COPD, depression, GERD.  CAD: Symptoms of exertional dyspnea since 10/2020 concerning for anginal equivalent/heart failure New EKG changes (compared to 06/2020 ) age-indeterminate anterior infarct, as well as inferolateral  ischemia. I am concerned that she possibly had a silent myocardial infarction event sometime in March 2022. She is not acutely symptomatic, therefore acute MI is less likely. I will obtain lab work including troponin, BNP, as well as repeat echocardiogram and Lexiscan nuclear stress test. I personally spoke with her son Marcello Moores over the phone and updated him regarding these new findings. Continue aspirin, statin, carvedilol, amlodipine. Lipids well controlled.  Hypertension: Uncontrolled.  Increase carvedilol to 2 tabs of 6.25 mg twice daily.  Aortic stenosis:  Moderate on echocardiogram in 06/2020.  Repeat echocardiogram given change in her symptoms.    F/u after above tests   Nigel Mormon, MD Pager: [phone] Office: [phone]

## 2020-12-27 ENCOUNTER — Other Ambulatory Visit: Payer: Self-pay

## 2020-12-27 ENCOUNTER — Emergency Department (HOSPITAL_COMMUNITY): Payer: Medicare HMO

## 2020-12-27 ENCOUNTER — Inpatient Hospital Stay (HOSPITAL_COMMUNITY)
Admission: EM | Admit: 2020-12-27 | Discharge: 2020-12-29 | DRG: 246 | Disposition: A | Discharge status: Home / Self Care | Payer: Medicare HMO | Attending: Cardiology | Admitting: Cardiology

## 2020-12-27 ENCOUNTER — Encounter (HOSPITAL_COMMUNITY): Payer: Self-pay

## 2020-12-27 DIAGNOSIS — R079 Chest pain, unspecified: Secondary | ICD-10-CM | POA: Diagnosis not present

## 2020-12-27 DIAGNOSIS — R0609 Other forms of dyspnea: Secondary | ICD-10-CM | POA: Diagnosis not present

## 2020-12-27 DIAGNOSIS — K219 Gastro-esophageal reflux disease without esophagitis: Secondary | ICD-10-CM | POA: Diagnosis present

## 2020-12-27 DIAGNOSIS — I252 Old myocardial infarction: Secondary | ICD-10-CM

## 2020-12-27 DIAGNOSIS — Z91040 Latex allergy status: Secondary | ICD-10-CM

## 2020-12-27 DIAGNOSIS — Z87891 Personal history of nicotine dependence: Secondary | ICD-10-CM | POA: Diagnosis not present

## 2020-12-27 DIAGNOSIS — Z800 Family history of malignant neoplasm of digestive organs: Secondary | ICD-10-CM | POA: Diagnosis not present

## 2020-12-27 DIAGNOSIS — I700 Atherosclerosis of aorta: Secondary | ICD-10-CM | POA: Diagnosis not present

## 2020-12-27 DIAGNOSIS — E785 Hyperlipidemia, unspecified: Secondary | ICD-10-CM | POA: Diagnosis present

## 2020-12-27 DIAGNOSIS — I2510 Atherosclerotic heart disease of native coronary artery without angina pectoris: Secondary | ICD-10-CM

## 2020-12-27 DIAGNOSIS — Z825 Family history of asthma and other chronic lower respiratory diseases: Secondary | ICD-10-CM | POA: Diagnosis not present

## 2020-12-27 DIAGNOSIS — R0602 Shortness of breath: Secondary | ICD-10-CM | POA: Diagnosis not present

## 2020-12-27 DIAGNOSIS — I209 Angina pectoris, unspecified: Secondary | ICD-10-CM

## 2020-12-27 DIAGNOSIS — I6523 Occlusion and stenosis of bilateral carotid arteries: Secondary | ICD-10-CM | POA: Diagnosis not present

## 2020-12-27 DIAGNOSIS — D751 Secondary polycythemia: Secondary | ICD-10-CM | POA: Diagnosis not present

## 2020-12-27 DIAGNOSIS — I350 Nonrheumatic aortic (valve) stenosis: Secondary | ICD-10-CM | POA: Diagnosis not present

## 2020-12-27 DIAGNOSIS — I083 Combined rheumatic disorders of mitral, aortic and tricuspid valves: Secondary | ICD-10-CM | POA: Diagnosis not present

## 2020-12-27 DIAGNOSIS — J432 Centrilobular emphysema: Secondary | ICD-10-CM | POA: Diagnosis not present

## 2020-12-27 DIAGNOSIS — Z90710 Acquired absence of both cervix and uterus: Secondary | ICD-10-CM

## 2020-12-27 DIAGNOSIS — I10 Essential (primary) hypertension: Secondary | ICD-10-CM | POA: Diagnosis not present

## 2020-12-27 DIAGNOSIS — Z90722 Acquired absence of ovaries, bilateral: Secondary | ICD-10-CM

## 2020-12-27 DIAGNOSIS — Z7982 Long term (current) use of aspirin: Secondary | ICD-10-CM

## 2020-12-27 DIAGNOSIS — I5033 Acute on chronic diastolic (congestive) heart failure: Secondary | ICD-10-CM | POA: Diagnosis not present

## 2020-12-27 DIAGNOSIS — J9621 Acute and chronic respiratory failure with hypoxia: Secondary | ICD-10-CM | POA: Diagnosis not present

## 2020-12-27 DIAGNOSIS — I25110 Atherosclerotic heart disease of native coronary artery with unstable angina pectoris: Principal | ICD-10-CM | POA: Diagnosis present

## 2020-12-27 DIAGNOSIS — Z79899 Other long term (current) drug therapy: Secondary | ICD-10-CM

## 2020-12-27 DIAGNOSIS — I25118 Atherosclerotic heart disease of native coronary artery with other forms of angina pectoris: Secondary | ICD-10-CM | POA: Diagnosis not present

## 2020-12-27 DIAGNOSIS — I110 Hypertensive heart disease with heart failure: Secondary | ICD-10-CM | POA: Diagnosis present

## 2020-12-27 DIAGNOSIS — J479 Bronchiectasis, uncomplicated: Secondary | ICD-10-CM | POA: Diagnosis not present

## 2020-12-27 DIAGNOSIS — Z955 Presence of coronary angioplasty implant and graft: Secondary | ICD-10-CM

## 2020-12-27 DIAGNOSIS — Z20822 Contact with and (suspected) exposure to covid-19: Secondary | ICD-10-CM | POA: Diagnosis not present

## 2020-12-27 DIAGNOSIS — I200 Unstable angina: Secondary | ICD-10-CM | POA: Diagnosis present

## 2020-12-27 DIAGNOSIS — D45 Polycythemia vera: Secondary | ICD-10-CM | POA: Diagnosis not present

## 2020-12-27 DIAGNOSIS — Z820 Family history of epilepsy and other diseases of the nervous system: Secondary | ICD-10-CM | POA: Diagnosis not present

## 2020-12-27 DIAGNOSIS — J439 Emphysema, unspecified: Secondary | ICD-10-CM | POA: Diagnosis not present

## 2020-12-27 DIAGNOSIS — E7800 Pure hypercholesterolemia, unspecified: Secondary | ICD-10-CM | POA: Diagnosis present

## 2020-12-27 DIAGNOSIS — Z8249 Family history of ischemic heart disease and other diseases of the circulatory system: Secondary | ICD-10-CM | POA: Diagnosis not present

## 2020-12-27 LAB — CBC
HCT: 46.7 % — ABNORMAL HIGH (ref 36.0–46.0)
HCT: 44.1 % (ref 36.0–46.0)
Hemoglobin: 15.4 g/dL — ABNORMAL HIGH (ref 12.0–15.0)
Hemoglobin: 14.2 g/dL (ref 12.0–15.0)
MCH: 34.3 pg — ABNORMAL HIGH (ref 26.0–34.0)
MCH: 33.9 pg (ref 26.0–34.0)
MCHC: 33 g/dL (ref 30.0–36.0)
MCHC: 32.2 g/dL (ref 30.0–36.0)
MCV: 104 fL — ABNORMAL HIGH (ref 80.0–100.0)
MCV: 105.3 fL — ABNORMAL HIGH (ref 80.0–100.0)
Platelets: 290 10*3/uL (ref 150–400)
Platelets: 262 10*3/uL (ref 150–400)
RBC: 4.49 MIL/uL (ref 3.87–5.11)
RBC: 4.19 MIL/uL (ref 3.87–5.11)
RDW: 14.9 % (ref 11.5–15.5)
RDW: 14.7 % (ref 11.5–15.5)
WBC: 6.7 10*3/uL (ref 4.0–10.5)
WBC: 6.3 10*3/uL (ref 4.0–10.5)
nRBC: 0 % (ref 0.0–0.2)
nRBC: 0 % (ref 0.0–0.2)

## 2020-12-27 LAB — BASIC METABOLIC PANEL
Anion gap: 8 (ref 5–15)
BUN: 20 mg/dL (ref 8–23)
CO2: 27 mmol/L (ref 22–32)
Calcium: 8.8 mg/dL — ABNORMAL LOW (ref 8.9–10.3)
Chloride: 106 mmol/L (ref 98–111)
Creatinine, Ser: 0.87 mg/dL (ref 0.44–1.00)
GFR, Estimated: >60 mL/min (ref >60)
Glucose, Bld: 95 mg/dL (ref 70–99)
Potassium: 4.3 mmol/L (ref 3.5–5.1)
Sodium: 141 mmol/L (ref 135–145)

## 2020-12-27 LAB — LIPID PANEL
Cholesterol: 122 mg/dL (ref 0–200)
HDL: 42 mg/dL (ref >40)
LDL Cholesterol: 65 mg/dL (ref 0–99)
Total CHOL/HDL Ratio: 2.9 RATIO
Triglycerides: 74 mg/dL (ref <150)
VLDL: 15 mg/dL (ref 0–40)

## 2020-12-27 LAB — RESP PANEL BY RT-PCR (FLU A&B, COVID) ARPGX2
Influenza A by PCR: NEGATIVE
Influenza B by PCR: NEGATIVE
SARS Coronavirus 2 by RT PCR: NEGATIVE

## 2020-12-27 LAB — TROPONIN I (HIGH SENSITIVITY)
Troponin I (High Sensitivity): 7 ng/L (ref <18)
Troponin I (High Sensitivity): 6 ng/L (ref <18)
Troponin I (High Sensitivity): 5 ng/L (ref <18)
Troponin I (High Sensitivity): 9 ng/L (ref <18)

## 2020-12-27 LAB — PROTIME-INR
INR: 1.2 (ref 0.8–1.2)
Prothrombin Time: 15 seconds (ref 11.4–15.2)

## 2020-12-27 LAB — APTT: aPTT: 33 seconds (ref 24–36)

## 2020-12-27 LAB — LIPASE, BLOOD: Lipase: 28 U/L (ref 11–51)

## 2020-12-27 LAB — BRAIN NATRIURETIC PEPTIDE: B Natriuretic Peptide: 524.7 pg/mL — ABNORMAL HIGH (ref 0.0–100.0)

## 2020-12-27 MED ORDER — ONDANSETRON HCL 4 MG/2ML IJ SOLN: 4.0000 mg | Freq: Four times a day (QID) | INTRAMUSCULAR | Status: DC | PRN

## 2020-12-27 MED ORDER — SODIUM CHLORIDE 0.9 % IV SOLN: 250.0000 mL | INTRAVENOUS | Status: DC | PRN

## 2020-12-27 MED ORDER — HEPARIN BOLUS VIA INFUSION
3000.0000 [IU] | Freq: Once | INTRAVENOUS | Status: AC
  Administered 2020-12-27: 3000 [IU] via INTRAVENOUS
  Filled 2020-12-27: qty 3000

## 2020-12-27 MED ORDER — ACETAMINOPHEN 325 MG PO TABS: 650.0000 mg | ORAL_TABLET | ORAL | Status: DC | PRN

## 2020-12-27 MED ORDER — NITROGLYCERIN 0.4 MG SL SUBL: 0.4000 mg | SUBLINGUAL_TABLET | SUBLINGUAL | Status: DC | PRN

## 2020-12-27 MED ORDER — HEPARIN SODIUM (PORCINE) 5000 UNIT/ML IJ SOLN: 5000.0000 [IU] | Freq: Three times a day (TID) | INTRAMUSCULAR | Status: DC

## 2020-12-27 MED ORDER — ASPIRIN EC 81 MG PO TBEC
81.0000 mg | DELAYED_RELEASE_TABLET | Freq: Every day | ORAL | Status: DC
  Administered 2020-12-29: 81 mg via ORAL
  Filled 2020-12-27: qty 1

## 2020-12-27 MED ORDER — SODIUM CHLORIDE 0.9% FLUSH: 3.0000 mL | INTRAVENOUS | Status: DC | PRN

## 2020-12-27 MED ORDER — HEPARIN (PORCINE) 25000 UT/250ML-% IV SOLN
600.0000 [IU]/h | INTRAVENOUS | Status: DC
  Administered 2020-12-27: 600 [IU]/h via INTRAVENOUS
  Filled 2020-12-27: qty 250

## 2020-12-27 MED ORDER — ASPIRIN EC 81 MG PO TBEC
81.0000 mg | DELAYED_RELEASE_TABLET | Freq: Every day | ORAL | Status: DC
  Administered 2020-12-27: 81 mg via ORAL
  Filled 2020-12-27: qty 1

## 2020-12-27 MED ORDER — ASPIRIN 81 MG PO CHEW
81.0000 mg | CHEWABLE_TABLET | ORAL | Status: AC
  Administered 2020-12-28: 81 mg via ORAL
  Filled 2020-12-27: qty 1

## 2020-12-27 MED ORDER — NITROGLYCERIN IN D5W 200-5 MCG/ML-% IV SOLN
0.0000 ug/min | INTRAVENOUS | Status: DC
  Administered 2020-12-27: 5 ug/min via INTRAVENOUS
  Administered 2020-12-28: 15 ug/min via INTRAVENOUS
  Filled 2020-12-27 (×2): qty 250

## 2020-12-27 MED ORDER — PANTOPRAZOLE SODIUM 40 MG IV SOLR
40.0000 mg | Freq: Once | INTRAVENOUS | Status: AC
  Administered 2020-12-27: 40 mg via INTRAVENOUS
  Filled 2020-12-27: qty 40

## 2020-12-27 MED ORDER — SODIUM CHLORIDE 0.9 % IV SOLN: INTRAVENOUS | Status: DC

## 2020-12-27 MED ORDER — SODIUM CHLORIDE 0.9% FLUSH: 3.0000 mL | Freq: Two times a day (BID) | INTRAVENOUS | Status: DC

## 2020-12-27 NOTE — ED Triage Notes (Signed)
Pt reports SHOB for awhile now, but reports central chest pain that radiates to her x3 days.

## 2020-12-27 NOTE — Progress Notes (Signed)
ANTICOAGULATION CONSULT NOTE - Initial Consult  Pharmacy Consult for Heparin  Indication: chest pain/ACS  Allergies  Allergen Reactions  . Latex Itching and Rash    Other reaction(s): Itching,Rash Red      Patient Measurements:   Heparin Dosing Weight: 51.3 kg (actual weight)  Vital Signs: Temp: 98.1 F (36.7 C) (04/28 1340) Temp Source: Oral (04/28 1340) BP: 145/62 (04/28 1600) Pulse Rate: 62 (04/28 1600)  Labs: Recent Labs    12/27/20 1334  HGB 15.4*  HCT 46.7*  PLT 290  CREATININE 0.87  TROPONINIHS 7    Estimated Creatinine Clearance: 32.7 mL/min (by C-G formula based on SCr of 0.87 mg/dL).   Medical History: Past Medical History:  Diagnosis Date  . Coronary artery disease   . Emphysema, unspecified ([place])    Both  . Heart attack ([place]) 2016  . Hyperlipidemia   . Hypertension    Assessment: 87 y/o F with a h/o CAD admitted with chest pain. Pharmacy consulted to initiate heparin infusion. No anticoagulants PTA. CBC WNL.   Goal of Therapy:  Heparin level 0.3-0.7 units/ml Monitor platelets by anticoagulation protocol: Yes   Plan:  Give 3000 units bolus x 1 Start heparin infusion at 600 units/hr Check anti-Xa level in 8 hours and daily while on heparin Continue to monitor H&H and platelets  Ulice Dash D 12/27/2020,4:50 PM

## 2020-12-27 NOTE — H&P (Signed)
CARDIOLOGY ADMIT NOTE   Patient ID: Melissa Barron MRN: [id number] DOB/AGE: 1931/06/14 87 y.o.  Admit date: 12/27/2020 Primary Physician:  [name], MD  Patient ID: Melissa Barron, female    DOB: [birth year], 87 y.o.   MRN: [id number]  Chief Complaint  Patient presents with  . Chest Pain  . Shortness of Breath   HPI:    Melissa Barron  is a 87 y.o. Fairly independent Caucasian female patient with hypertension, hyperlipidemia, COPD, GERD, polycythemia vera on chronic hydroxyurea therapy, coronary disease with history of myocardial infarction in 2018 was seen by my partner Dr. Virgina Jock yesterday on 12/26/2020 for exertional chest pain and dyspnea on exertion last 3-4.  He had recommended that she come to the emergency room if she has recurrence of chest pain as she had markedly abnormal EKG suggestive of a very large inferior lateral lateral subendocardial infarct.  Last evening she started having chest discomfort in the middle of the chest while she was just resting.  She had 2 or 3 episodes last night but this morning while she was driving, started having chest discomfort again and hence decided to come to the emergency room.  Upon presentation to the ED, she had markedly abnormal EKG which normalized and for set of troponins were negative, but in view of her symptoms I was requested to evaluate the patient in the ED.  Her son is present at the bedside.  States that she is feeling better now and thinks that she could potentially go home.  She denies PND or orthopnea although dyspnea has been very severe to doing even minimal things.  No cough, no fever.  No leg edema.  No palpitations or dizziness or syncope.  Past Medical History:  Diagnosis Date  . Coronary artery disease   . Emphysema, unspecified ([place])    Both  . Heart attack ([place]) 2016  . Hyperlipidemia   . Hypertension    Past Surgical History:  Procedure Laterality Date  . TOTAL ABDOMINAL HYSTERECTOMY W/ BILATERAL  SALPINGOOPHORECTOMY  1984   Social History   Socioeconomic History  . Marital status: Married    Spouse name: Not on file  . Number of children: 4  . Years of education: Not on file  . Highest education level: Not on file  Occupational History  . Not on file  Tobacco Use  . Smoking status: Former Smoker    Packs/day: 1.50    Years: 30.00    Pack years: 45.00    Types: Cigarettes    Quit date: 1980    Years since quitting: 42.3  . Smokeless tobacco: Never Used  Vaping Use  . Vaping Use: Never used  Substance and Sexual Activity  . Alcohol use: Yes    Alcohol/week: 1.0 standard drink    Types: 1 Glasses of wine per week    Comment: 7 days a week/1 a day  . Drug use: Never  . Sexual activity: Not on file  Other Topics Concern  . Not on file  Social History Narrative  . Not on file   Social Determinants of Health   Financial Resource Strain: Not on file  Food Insecurity: Not on file  Transportation Needs: Not on file  Physical Activity: Not on file  Stress: Not on file  Social Connections: Not on file  Intimate Partner Violence: Not on file   Family History  Problem Relation Age of Onset  . Heart attack Mother   . Colon cancer Brother   . Parkinson's  disease Sister   . Emphysema Sister     ROS  Review of Systems  Constitutional: Negative.  HENT: Negative.   Cardiovascular: Positive for chest pain, dyspnea on exertion and orthopnea. Negative for leg swelling.  Respiratory: Positive for wheezing.   Skin: Negative.   Musculoskeletal: Positive for joint pain.  Gastrointestinal: Negative.  Negative for melena.  Neurological: Negative.   Psychiatric/Behavioral: Negative.   All other systems reviewed and are negative.  Objective   Vitals with BMI 12/27/2020 12/27/2020 12/27/2020  Height - - -  Weight - - -  BMI - - -  Systolic 563 149 702  Diastolic 63 55 59  Pulse 70 72 72      Physical Exam Constitutional:      Appearance: Normal appearance. She is  cachectic.  HENT:     Head: Atraumatic.  Eyes:     Extraocular Movements: Extraocular movements intact.  Neck:     Vascular: No carotid bruit or JVD.  Cardiovascular:     Rate and Rhythm: Normal rate and regular rhythm.     Pulses: Intact distal pulses.          Carotid pulses are on the right side with bruit and on the left side with bruit.      Femoral pulses are 2+ on the right side and 2+ on the left side.      Dorsalis pedis pulses are 2+ on the right side and 2+ on the left side.       Posterior tibial pulses are 1+ on the right side and 1+ on the left side.     Heart sounds: S1 normal and S2 normal. Murmur heard.   Harsh midsystolic murmur is present with a grade of 3/6 at the upper right sternal border radiating to the neck. No gallop.   Pulmonary:     Effort: Pulmonary effort is normal.     Breath sounds: Normal breath sounds.  Abdominal:     General: Bowel sounds are normal.     Palpations: Abdomen is soft.  Musculoskeletal:        General: No swelling or tenderness.     Cervical back: Normal range of motion.  Skin:    General: Skin is warm and dry.  Neurological:     General: No focal deficit present.     Mental Status: She is oriented to person, place, and time.    Laboratory examination:   Recent Labs    12/27/20 1334  NA 141  K 4.3  CL 106  CO2 27  GLUCOSE 95  BUN 20  CREATININE 0.87  CALCIUM 8.8*  GFRNONAA >60   estimated creatinine clearance is 32.7 mL/min (by C-G formula based on SCr of 0.87 mg/dL).  CMP Latest Ref Rng & Units 12/27/2020  Glucose 70 - 99 mg/dL 95  BUN 8 - 23 mg/dL 20  Creatinine 0.44 - 1.00 mg/dL 0.87  Sodium 135 - 145 mmol/L 141  Potassium 3.5 - 5.1 mmol/L 4.3  Chloride 98 - 111 mmol/L 106  CO2 22 - 32 mmol/L 27  Calcium 8.9 - 10.3 mg/dL 8.8(L)   CBC Latest Ref Rng & Units 12/27/2020  WBC 4.0 - 10.5 K/uL 6.7  Hemoglobin 12.0 - 15.0 g/dL 15.4(H)  Hematocrit 36.0 - 46.0 % 46.7(H)  Platelets 150 - 400 K/uL 290   Lipid  Panel  No results found for: CHOL, TRIG, HDL, CHOLHDL, VLDL, LDLCALC, LDLDIRECT HEMOGLOBIN A1C No results found for: HGBA1C, MPG TSH No results for  input(s): TSH in the last 8760 hours. BNP (last 3 results) Recent Labs    12/27/20 1334  BNP 524.7*   Medications and allergies   Allergies  Allergen Reactions  . Latex Itching and Rash    Other reaction(s): Itching,Rash Red      Current Outpatient Medications  Medication Instructions  . amLODipine (NORVASC) 5 mg, Oral, Daily  . aspirin EC 81 mg, Oral, Daily, Swallow whole.   Marland Kitchen atorvastatin (LIPITOR) 80 MG tablet 1 tablet, Oral, Daily  . carvedilol (COREG) 12.5 mg, Oral, 2 times daily  . denosumab (PROLIA) 60 mg, Subcutaneous, Every 6 months  . hydroxyurea (HYDREA) 500 mg, Oral, Daily  . irbesartan (AVAPRO) 300 mg, Oral, Daily at bedtime  . isosorbide mononitrate (IMDUR) 30 mg, Oral, Daily  . pantoprazole (PROTONIX) 40 mg, Oral, Daily  . sertraline (ZOLOFT) 100 MG tablet 1 tablet, Oral, Daily  . Tiotropium Bromide Monohydrate (SPIRIVA RESPIMAT) 2.5 MCG/ACT AERS 2 puffs, Inhalation, Daily  . Vitamin D3 10,000 Units, Oral, Daily   Scheduled Meds: . aspirin EC  81 mg Oral Daily   Continuous Infusions: . heparin 600 Units/hr (12/27/20 1708)  . nitroGLYCERIN 40 mcg/min (12/27/20 1755)   PRN Meds:.acetaminophen, nitroGLYCERIN, ondansetron (ZOFRAN) IV   Radiology:   PFT 01/28/2018: Flow and volume loops show coving.  Spirometry shows only mild airway obstruction pre bronchodilator challenge which resolved post bronchodilator challenge.  There is no significant bronchodilator response.  Lung volumes are normal.  Carbon monoxide diffusion capacity uncorrected for hemoglobin is moderately decreased.  CT scan chest 02/10/2018: PANLOBULAR EMPHYSEMA WITH BILATERAL UPPER AND LOWER LOBE  SUBPLEURAL INTERSTITIAL THICKENING. SUBTLE AREAS OF  BRONCHIECTASIS IN THE LUNG APICES AND LUNG BASES. NO ACUTE PROCESS.   DG Chest Port 1 View  Result Date: 12/27/2020 CLINICAL DATA:  Chest pain and shortness of breath. EXAM: PORTABLE CHEST 1 VIEW COMPARISON:  None. FINDINGS: Heart size at the upper limits of normal, accentuated by technique. Atherosclerotic calcification of the aortic arch. Diffusely coarsened interstitial markings with emphysematous changes. No focal consolidation, pleural effusion, or pneumothorax. No acute osseous abnormality. IMPRESSION: 1. No active disease. 2. Chronic interstitial lung disease, likely smoking-related. Electronically Signed   By: Titus Dubin M.D.   On: 12/27/2020 15:16    Cardiac Studies:   Lexiscan Myoview stress test 08/18/2018: 1. The patient underwent Regadenoson stress testing due to non-cardiac physical limitations.  2. The TID ratio was 0.71  3. The post stress ejection fraction is measured at 69%.  4. Normal left ventricular wall motion and segmental function.  5. Normal myocardial perfusion study without evidence of inducible ischemia.    Echocardiogram 06/26/2020:  Left ventricle cavity is normal in size. Moderate concentric hypertrophy  of the left ventricle. Normal global wall motion. Normal LV systolic  function with EF 55%. Doppler evidence of grade I (impaired) diastolic  dysfunction, normal LAP. Calculated EF 55%.  Left atrial cavity is moderately dilated. Aneurysmal interatrial septum  without 2D or color Doppler evidence of shunting.  Aortic valve is probably bileaflet with mild calcificaion. Moderate aortic  stenosis. Vmax 3.1 m/sec, mean PG 25 mmHg, AVA 0.8 cm2, dimensionless  index 0.36. Moderate (Grade II) aortic regurgitation.Mild mitral valve  leaflet calcification. Mildly restricted mitral valve leaflets. Moderate  (Grade II) mitral regurgitation.  Moderate tricuspid regurgitation. Estimated pulmonary artery systolic  pressure 39 mmHg.   Carotid artery duplex 06/26/2020:  Doppler velocity suggests stenosis in the right internal carotid artery  (16-49%).  Stenosis in the right external carotid artery (<  50%).  Doppler velocity suggests stenosis in the left internal carotid artery  (50-69%). Stenosis in the left external carotid artery (<50%).  Antegrade right vertebral artery flow. Antegrade left vertebral artery flow.  Follow up in six months is appropriate if clinically indicated.  EKG:    EKG 12/27/2020: Serial EKGs evaluated.  Initial EKG reveals ST elevation in anterior leads, inferior and anterolateral T wave inversions, final EKG reveals normal sinus rhythm.  Assessment   Melissa Barron is a 87 y.o. Fairly independent Caucasian female patient with hypertension, hyperlipidemia, COPD, GERD, polycythemia vera on chronic hydroxyurea therapy, coronary disease with history of myocardial infarction in 2018 was seen by my partner Dr. Virgina Jock yesterday on 12/26/2020 for exertional chest pain and dyspnea on exertion last 3-4.  He had recommended that she come to the emergency room if she has recurrence of chest pain as she had markedly abnormal EKG suggestive of a very large inferior lateral lateral subendocardial infarct.  Due to recurrent chest pain, she presented to the emergency room at [facility].  1.  Unstable angina with dynamic EKG abnormality, upon presentation patient also had transient ST elevations in the anterior leads, also had profound inferolateral and anterior T wave inversions on the second EKG followed by normalization of the EKG on the final EKG.  Presently chest pain-free and for set of troponins negative. 2.  Moderate aortic stenosis and moderate aortic regurgitation by echocardiogram in September 2021.  She also has moderate MR.  Does not appear to be a contraindication for PCI. 3.  Primary hypertension 4.  Hypercholesterolemia 5.  Acute on chronic diastolic heart failure.   6. COPD with centrilobular emphysema and bronchiectasis and hypoxemia. 7.  Polycythemia on chronic hydroxyurea. 8.  Asymptomatic bilateral carotid  artery stenosis.  Recommendations:   Patient with unstable anginal symptoms and has known coronary artery disease with history of myocardial infarction in 2018, no details available, needs admission to the hospital and further evaluation.  We will start the patient on IV heparin and IV nitroglycerin, we will schedule her for cardiac catheterization tomorrow morning unless she has recurrence of chest pain, she will need emergent cardiac catheterization.  With regard to hypertension, blood pressure is elevated, hopefully adding nitroglycerin will help with blood pressure control.  Acute diastolic heart failure probably related to underlying coronary ischemia and hypertension.  She is on appropriately on high intensity high-dose statins.  She has asymptomatic bilateral carotid artery stenosis that needs further evaluation in the outpatient basis.  With regard to initial hypoxemia and dyspnea, she has underlying COPD and centrilobular emphysema and bronchiectasis as well sats are improved and she is stable presently.  Her polycythemia is being managed by oncology and hemoglobin has remained stable.  I ordered lipids.  Her son is present at the bedside and all questions answered. Schedule for cardiac catheterization, and possible angioplasty. We discussed regarding risks, benefits, alternatives to this including stress testing, CTA and continued medical therapy. Patient wants to proceed. Understands <1-2% risk of death, stroke, MI, urgent CABG, bleeding, infection, renal failure but not limited to these.    Adrian Prows, MD, [facility] 12/27/2020, [place] PM Office: [phone] Pager: [phone]

## 2020-12-27 NOTE — ED Provider Notes (Signed)
MSE was initiated and I personally evaluated the patient and placed orders (if any) at  1:32 PM on December 27, 2020.   Chief Complaint: chest pain, sob   HPI:  chest pain that radiates to back for the last three days, has two stents. CP feels like pressure, also admits to SOB, wears oxygen infrequently   ROS: cp, sob   Physical Exam:  Gen:                in distress  HEENT:          Atraumatic  Resp:              satting at 82 percent on room air, wheezing on lower bases  Cardiac:          Normal rate  Abd:                Nondistended, nontender  MSK:              Moves extremities without difficulty  Neuro:            Speech clear,EOMS intact      Pt needs to go back immediately, not stable for waiting room,nurse aware.   Initiation of care has begun. The patient has been counseled on the process, plan, and necessity for staying for the completion/evaluation, and the remainder of the medical screening examination    Alfredia Client, PA-C 12/27/20 [place], Isabella, DO 12/27/20 1556

## 2020-12-27 NOTE — ED Provider Notes (Addendum)
[facility] DEPT Provider Note   CSN: [id number] Arrival date & time: 12/27/20  1322     History Chief Complaint  Patient presents with  . Chest Pain  . Shortness of Breath    Daphanie Oquendo is a 87 y.o. female.  HPI  HPI: A 87 year old patient with a history of hypertension and hypercholesterolemia presents for evaluation of chest pain. Initial onset of pain was less than one hour ago. The patient's chest pain is described as heaviness/pressure/tightness and is not worse with exertion. The patient's chest pain is middle- or left-sided, is not well-localized, is not sharp and does not radiate to the arms/jaw/neck. The patient does not complain of nausea and denies diaphoresis. The patient has smoked in the past 90 days. The patient has no history of stroke, has no history of peripheral artery disease, denies any history of treated diabetes, has no relevant family history of coronary artery disease (first degree relative at less than age 26) and does not have an elevated BMI (>=30).    Patient has history of CAD.  Over the last several weeks she has been having exertional shortness of breath.  Yesterday she was seen by cardiology service.  There is plan for her to get stress test.  This morning she started having epigastric, lower thoracic midsternal chest pain that is intermittent and radiating to the back.  She has had similar symptoms in the past, but they last for few seconds and resolved, today she has been constantly having this discomfort.  Symptoms are not worse with inspiration or with exertion.  There is no specific evoking factor and no association with food intake.  In addition to CAD, she also has aortic stenosis, COPD and reports that she has been requiring more oxygen than usual.  Past Medical History:  Diagnosis Date  . Coronary artery disease   . Emphysema, unspecified ([place])    Both  . Heart attack ([place]) 2016  . Hyperlipidemia   .  Hypertension     Patient Active Problem List   Diagnosis Date Noted  . Unstable angina ([place]) 12/27/2020  . Exertional dyspnea 12/26/2020  . Nonrheumatic aortic valve stenosis 12/26/2020  . Polycythemia vera ([place]) 07/06/2020  . Coronary artery disease involving native coronary artery of native heart without angina pectoris 06/18/2020  . Bruit of right carotid artery 06/18/2020  . Family history of abdominal aortic aneurysm (AAA) 06/18/2020  . Essential hypertension 06/18/2020    Past Surgical History:  Procedure Laterality Date  . TOTAL ABDOMINAL HYSTERECTOMY W/ BILATERAL SALPINGOOPHORECTOMY  1984     OB History   No obstetric history on file.     Family History  Problem Relation Age of Onset  . Heart attack Mother   . Colon cancer Brother   . Parkinson's disease Sister   . Emphysema Sister     Social History   Tobacco Use  . Smoking status: Former Smoker    Packs/day: 1.50    Years: 30.00    Pack years: 45.00    Types: Cigarettes    Quit date: 1980    Years since quitting: 42.3  . Smokeless tobacco: Never Used  Vaping Use  . Vaping Use: Never used  Substance Use Topics  . Alcohol use: Yes    Alcohol/week: 1.0 standard drink    Types: 1 Glasses of wine per week    Comment: 7 days a week/1 a day  . Drug use: Never    Home Medications Prior to Admission  medications   Medication Sig Start Date End Date Taking? Authorizing Provider  amLODipine (NORVASC) 5 MG tablet Take 1 tablet (5 mg total) by mouth daily. 06/18/20  Yes Patwardhan, Reynold Bowen, MD  aspirin EC 81 MG tablet Take 1 tablet (81 mg total) by mouth daily. Swallow whole. 06/18/20  Yes Patwardhan, Manish J, MD  atorvastatin (LIPITOR) 80 MG tablet Take 1 tablet by mouth daily. 05/29/20  Yes [provider]  carvedilol (COREG) 6.25 MG tablet Take 2 tablets (12.5 mg total) by mouth 2 (two) times daily. 12/26/20  Yes Patwardhan, Manish J, MD  Cholecalciferol (VITAMIN D3) 250 MCG (10000 UT) capsule Take  1 capsule (10,000 Units total) by mouth daily. 07/06/20  Yes Nicholas Lose, MD  denosumab (PROLIA) 60 MG/ML SOSY injection Inject 60 mg into the skin every 6 (six) months.   Yes [provider]  hydroxyurea (HYDREA) 500 MG capsule Take 1 capsule (500 mg total) by mouth daily. 07/06/20  Yes Nicholas Lose, MD  irbesartan (AVAPRO) 300 MG tablet Take 1 tablet (300 mg total) by mouth at bedtime. 06/18/20  Yes Patwardhan, Manish J, MD  isosorbide mononitrate (IMDUR) 30 MG 24 hr tablet Take 1 tablet (30 mg total) by mouth daily. 06/18/20  Yes Patwardhan, Manish J, MD  pantoprazole (PROTONIX) 40 MG tablet Take 1 tablet (40 mg total) by mouth daily. 06/18/20  Yes Patwardhan, Manish J, MD  sertraline (ZOLOFT) 100 MG tablet Take 1 tablet by mouth daily. 04/24/20  Yes [provider]  Tiotropium Bromide Monohydrate (SPIRIVA RESPIMAT) 2.5 MCG/ACT AERS Inhale 2 puffs into the lungs daily. 05/04/19  Yes [provider]    Allergies    Latex  Review of Systems   Review of Systems  Constitutional: Positive for activity change.  Respiratory: Positive for shortness of breath.   Cardiovascular: Positive for chest pain.  Musculoskeletal: Positive for back pain.  Allergic/Immunologic: Negative for immunocompromised state.  Neurological: Negative for weakness, numbness and headaches.  Hematological: Does not bruise/bleed easily.  All other systems reviewed and are negative.   Physical Exam Updated Vital Signs BP (!) 154/57   Pulse 69   Temp (!) 97.2 F (36.2 C) (Oral)   Resp 16   Ht 5' (1.524 m)   Wt 50.7 kg   SpO2 92%   BMI 21.81 kg/m   Physical Exam Vitals and nursing note reviewed.  Constitutional:      Appearance: She is well-developed.  HENT:     Head: Normocephalic and atraumatic.  Cardiovascular:     Rate and Rhythm: Normal rate.     Pulses:          Radial pulses are 2+ on the right side and 2+ on the left side.       Dorsalis pedis pulses are 2+ on the right  side and 2+ on the left side.     Heart sounds: Murmur heard.   Systolic murmur is present with a grade of 4/6.   Pulmonary:     Effort: Pulmonary effort is normal. Tachypnea present. No respiratory distress.     Breath sounds: No decreased breath sounds or wheezing.  Abdominal:     General: Bowel sounds are normal.     Palpations: Abdomen is soft.     Tenderness: There is no abdominal tenderness.  Musculoskeletal:     Cervical back: Normal range of motion and neck supple.  Skin:    General: Skin is warm and dry.  Neurological:     Mental Status:  She is alert and oriented to person, place, and time.     ED Results / Procedures / Treatments   Labs (all labs ordered are listed, but only abnormal results are displayed) Labs Reviewed  BASIC METABOLIC PANEL - Abnormal; Notable for the following components:      Result Value   Calcium 8.8 (*)    All other components within normal limits  CBC - Abnormal; Notable for the following components:   Hemoglobin 15.4 (*)    HCT 46.7 (*)    MCV 104.0 (*)    MCH 34.3 (*)    All other components within normal limits  BRAIN NATRIURETIC PEPTIDE - Abnormal; Notable for the following components:   B Natriuretic Peptide 524.7 (*)    All other components within normal limits  CBC - Abnormal; Notable for the following components:   MCV 105.3 (*)    All other components within normal limits  RESP PANEL BY RT-PCR (FLU A&B, COVID) ARPGX2  LIPASE, BLOOD  APTT  PROTIME-INR  LIPID PANEL  CBC  HEPARIN LEVEL (UNFRACTIONATED)  TROPONIN I (HIGH SENSITIVITY)  TROPONIN I (HIGH SENSITIVITY)  TROPONIN I (HIGH SENSITIVITY)  TROPONIN I (HIGH SENSITIVITY)    EKG EKG Interpretation  Date/Time:  Thursday December 27 2020 13:31:26 EDT Ventricular Rate:  60 PR Interval:  242 QRS Duration: 93 QT Interval:  470 QTC Calculation: 470 R Axis:   141 Text Interpretation: Sinus rhythm Prolonged PR interval Probable anteroseptal infarct, old Abnormal T,  consider ischemia, anterior leads Repeat tracings suggested Confirmed by Varney Biles [phone]) on 12/27/2020 1:50:32 PM    EKG Interpretation  Date/Time:  Thursday December 27 2020 14:18:48 EDT Ventricular Rate:  61 PR Interval:  234 QRS Duration: 102 QT Interval:  467 QTC Calculation: 471 R Axis:   51 Text Interpretation: Sinus rhythm Prolonged PR interval Low voltage, precordial leads Abnormal R-wave progression, early transition Borderline repolarization abnormality POSTERIOR EKG Confirmed by Varney Biles ([id number]) on 12/27/2020 4:43:46 PM        Radiology DG Chest Port 1 View  Result Date: 12/27/2020 CLINICAL DATA:  Chest pain and shortness of breath. EXAM: PORTABLE CHEST 1 VIEW COMPARISON:  None. FINDINGS: Heart size at the upper limits of normal, accentuated by technique. Atherosclerotic calcification of the aortic arch. Diffusely coarsened interstitial markings with emphysematous changes. No focal consolidation, pleural effusion, or pneumothorax. No acute osseous abnormality. IMPRESSION: 1. No active disease. 2. Chronic interstitial lung disease, likely smoking-related. Electronically Signed   By: Titus Dubin M.D.   On: 12/27/2020 15:16    Procedures .Critical Care Performed by: Varney Biles, MD Authorized by: Varney Biles, MD   Critical care provider statement:    Critical care time (minutes):  52   Critical care was necessary to treat or prevent imminent or life-threatening deterioration of the following conditions:  Cardiac failure   Critical care was time spent personally by me on the following activities:  Discussions with consultants, evaluation of patient's response to treatment, examination of patient, ordering and performing treatments and interventions, ordering and review of laboratory studies, ordering and review of radiographic studies, pulse oximetry, re-evaluation of patient's condition, obtaining history from patient or surrogate and review of old  charts     Medications Ordered in ED Medications  nitroGLYCERIN 50 mg in dextrose 5 % 250 mL (0.2 mg/mL) infusion (50 mcg/min Intravenous Rate/Dose Change 12/27/20 1802)  heparin ADULT infusion 100 units/mL (25000 units/262m) (600 Units/hr Intravenous New Bag/Given 12/27/20 1708)  sodium chloride flush (NS) 0.9 %  injection 3 mL (has no administration in time range)  sodium chloride flush (NS) 0.9 % injection 3 mL (has no administration in time range)  0.9 %  sodium chloride infusion (has no administration in time range)  aspirin chewable tablet 81 mg (has no administration in time range)  0.9 %  sodium chloride infusion (has no administration in time range)  nitroGLYCERIN (NITROSTAT) SL tablet 0.4 mg (has no administration in time range)  acetaminophen (TYLENOL) tablet 650 mg (has no administration in time range)  ondansetron (ZOFRAN) injection 4 mg (has no administration in time range)  aspirin EC tablet 81 mg (has no administration in time range)  pantoprazole (PROTONIX) injection 40 mg (40 mg Intravenous Given 12/27/20 1458)  heparin bolus via infusion 3,000 Units (3,000 Units Intravenous Bolus from Bag 12/27/20 1707)    ED Course  I have reviewed the triage vital signs and the nursing notes.  Pertinent labs & imaging results that were available during my care of the patient were reviewed by me and considered in my medical decision making (see chart for details).    MDM Rules/Calculators/A&P HEAR Score: 6                        Final Clinical Impression(s) / ED Diagnoses Final diagnoses:  Acute on chronic respiratory failure with hypoxia (HCC)  Angina pectoris (HCC)  Unstable angina ([place])   87 year old female with history of aortic stenosis, CAD, COPD comes in a chief complaint of epigastric chest pain radiating to the back.  She also has been having exertional shortness of breath that has progressed over the last several weeks.  Initial concern is for ACS along with thoracic  artery aneurysm, dissection, AAA, PE.  Clinically does not appear to be a GI etiology such as esophageal spasm, pancreatitis, gastritis.  Her EKG has some nonspecific changes.  No clear evidence of ST elevation MI. Vascular exam, abdominal exam is reassuring.  Plan is to get x-ray, troponins, repeat EKG and reassess. I have reviewed the note from Dr. Virgina Jock, cardiology from yesterday. CT scan from outside institution in 2018 in 2019 of the chest abdomen and pelvis did not reveal any AAA, thoracic artery aneurysm or PE.  Reassessment: Discussed case with Dr. Einar Gip, cardiology.  He came and saw the patient and requested nitro drip along with heparin and admission to [facility]. Results and plan discussed with the patient and his son.  Rx / DC Orders ED Discharge Orders    None       Varney Biles, MD 12/27/20 [place], Emmanuella Mirante, MD 12/27/20 2258

## 2020-12-27 NOTE — ED Notes (Signed)
Attempted to call report. Receiving RN will call back.

## 2020-12-28 ENCOUNTER — Encounter (HOSPITAL_COMMUNITY)
Admission: EM | Disposition: A | Discharge status: Home / Self Care | Payer: Self-pay | Source: Home / Self Care | Attending: Cardiology

## 2020-12-28 ENCOUNTER — Inpatient Hospital Stay (HOSPITAL_COMMUNITY): Payer: Medicare HMO

## 2020-12-28 ENCOUNTER — Other Ambulatory Visit (HOSPITAL_COMMUNITY): Payer: Self-pay

## 2020-12-28 ENCOUNTER — Other Ambulatory Visit (HOSPITAL_COMMUNITY): Payer: Medicare HMO

## 2020-12-28 HISTORY — PX: INTRAVASCULAR PRESSURE WIRE/FFR STUDY: CATH118243

## 2020-12-28 HISTORY — PX: CORONARY ATHERECTOMY: CATH118238

## 2020-12-28 HISTORY — PX: LEFT HEART CATH AND CORONARY ANGIOGRAPHY: CATH118249

## 2020-12-28 LAB — POCT ACTIVATED CLOTTING TIME
Activated Clotting Time: 225 seconds
Activated Clotting Time: 315 seconds
Activated Clotting Time: 351 seconds
Activated Clotting Time: 880 seconds
Activated Clotting Time: 225 seconds
Activated Clotting Time: 279 seconds
Activated Clotting Time: 309 seconds
Activated Clotting Time: 220 seconds
Activated Clotting Time: 172 seconds

## 2020-12-28 LAB — CBC
HCT: 40.4 % (ref 36.0–46.0)
Hemoglobin: 13.4 g/dL (ref 12.0–15.0)
MCH: 34.3 pg — ABNORMAL HIGH (ref 26.0–34.0)
MCHC: 33.2 g/dL (ref 30.0–36.0)
MCV: 103.3 fL — ABNORMAL HIGH (ref 80.0–100.0)
Platelets: 260 10*3/uL (ref 150–400)
RBC: 3.91 MIL/uL (ref 3.87–5.11)
RDW: 14.8 % (ref 11.5–15.5)
WBC: 6.6 10*3/uL (ref 4.0–10.5)
nRBC: 0 % (ref 0.0–0.2)

## 2020-12-28 LAB — HEPARIN LEVEL (UNFRACTIONATED)
Heparin Unfractionated: <0.1 IU/mL — ABNORMAL LOW (ref 0.30–0.70)
Heparin Unfractionated: <0.1 IU/mL — ABNORMAL LOW (ref 0.30–0.70)

## 2020-12-28 SURGERY — LEFT HEART CATH AND CORONARY ANGIOGRAPHY
Anesthesia: LOCAL

## 2020-12-28 MED ORDER — CLOPIDOGREL BISULFATE 300 MG PO TABS
ORAL_TABLET | ORAL | Status: DC | PRN
  Administered 2020-12-28: 600 mg via ORAL

## 2020-12-28 MED ORDER — HEPARIN (PORCINE) IN NACL 1000-0.9 UT/500ML-% IV SOLN
INTRAVENOUS | Status: AC
  Filled 2020-12-28: qty 1000

## 2020-12-28 MED ORDER — HEPARIN (PORCINE) IN NACL 1000-0.9 UT/500ML-% IV SOLN
INTRAVENOUS | Status: DC | PRN
  Administered 2020-12-28 (×3): 500 mL

## 2020-12-28 MED ORDER — VERAPAMIL HCL 2.5 MG/ML IV SOLN
INTRAVENOUS | Status: AC
  Filled 2020-12-28: qty 2

## 2020-12-28 MED ORDER — CARVEDILOL 6.25 MG PO TABS
6.2500 mg | ORAL_TABLET | Freq: Two times a day (BID) | ORAL | Status: DC
  Administered 2020-12-28 – 2020-12-29 (×2): 6.25 mg via ORAL
  Filled 2020-12-28 (×2): qty 1

## 2020-12-28 MED ORDER — LIDOCAINE HCL (PF) 1 % IJ SOLN
INTRAMUSCULAR | Status: AC
  Filled 2020-12-28: qty 30

## 2020-12-28 MED ORDER — HYDRALAZINE HCL 20 MG/ML IJ SOLN: 10.0000 mg | INTRAMUSCULAR | Status: AC | PRN

## 2020-12-28 MED ORDER — ONDANSETRON HCL 4 MG/2ML IJ SOLN: 4.0000 mg | Freq: Four times a day (QID) | INTRAMUSCULAR | Status: DC | PRN

## 2020-12-28 MED ORDER — CLOPIDOGREL BISULFATE 300 MG PO TABS
ORAL_TABLET | ORAL | Status: AC
  Filled 2020-12-28: qty 2

## 2020-12-28 MED ORDER — LIDOCAINE HCL (PF) 1 % IJ SOLN
INTRAMUSCULAR | Status: DC | PRN
  Administered 2020-12-28: 2 mL
  Administered 2020-12-28: 4 mL

## 2020-12-28 MED ORDER — VERAPAMIL HCL 2.5 MG/ML IV SOLN
INTRAVENOUS | Status: DC | PRN
  Administered 2020-12-28: 10 mL via INTRA_ARTERIAL

## 2020-12-28 MED ORDER — NITROGLYCERIN 1 MG/10 ML FOR IR/CATH LAB
INTRA_ARTERIAL | Status: DC | PRN
  Administered 2020-12-28: 200 ug via INTRA_ARTERIAL
  Administered 2020-12-28: 100 ug via INTRACORONARY

## 2020-12-28 MED ORDER — MIDAZOLAM HCL 2 MG/2ML IJ SOLN
INTRAMUSCULAR | Status: AC
  Filled 2020-12-28: qty 2

## 2020-12-28 MED ORDER — FENTANYL CITRATE (PF) 100 MCG/2ML IJ SOLN
INTRAMUSCULAR | Status: DC | PRN
  Administered 2020-12-28 (×4): 25 ug via INTRAVENOUS

## 2020-12-28 MED ORDER — CLOPIDOGREL BISULFATE 75 MG PO TABS
75.0000 mg | ORAL_TABLET | Freq: Every day | ORAL | 1 refills | Status: DC
  Filled 2020-12-28: qty 30, 30d supply, fill #0

## 2020-12-28 MED ORDER — VIPERSLIDE LUBRICANT OPTIME
TOPICAL | Status: DC | PRN
  Administered 2020-12-28: 20 mL via SURGICAL_CAVITY

## 2020-12-28 MED ORDER — FAMOTIDINE IN NACL 20-0.9 MG/50ML-% IV SOLN
INTRAVENOUS | Status: AC
  Filled 2020-12-28: qty 50

## 2020-12-28 MED ORDER — IOHEXOL 350 MG/ML SOLN
INTRAVENOUS | Status: DC | PRN
  Administered 2020-12-28: 125 mL via INTRA_ARTERIAL

## 2020-12-28 MED ORDER — SODIUM CHLORIDE 0.9% FLUSH
3.0000 mL | Freq: Two times a day (BID) | INTRAVENOUS | Status: DC
  Administered 2020-12-29: 3 mL via INTRAVENOUS

## 2020-12-28 MED ORDER — FENTANYL CITRATE (PF) 100 MCG/2ML IJ SOLN
INTRAMUSCULAR | Status: AC
  Filled 2020-12-28: qty 2

## 2020-12-28 MED ORDER — SODIUM CHLORIDE 0.9 % IV SOLN: 250.0000 mL | INTRAVENOUS | Status: DC | PRN

## 2020-12-28 MED ORDER — MIDAZOLAM HCL 2 MG/2ML IJ SOLN
INTRAMUSCULAR | Status: DC | PRN
  Administered 2020-12-28 (×3): 1 mg via INTRAVENOUS

## 2020-12-28 MED ORDER — HEPARIN SODIUM (PORCINE) 1000 UNIT/ML IJ SOLN
INTRAMUSCULAR | Status: AC
  Filled 2020-12-28: qty 1

## 2020-12-28 MED ORDER — SODIUM CHLORIDE 0.9% FLUSH: 3.0000 mL | INTRAVENOUS | Status: DC | PRN

## 2020-12-28 MED ORDER — SODIUM CHLORIDE 0.9 % IV SOLN: INTRAVENOUS | Status: AC

## 2020-12-28 MED ORDER — FAMOTIDINE IN NACL 20-0.9 MG/50ML-% IV SOLN
INTRAVENOUS | Status: AC | PRN
  Administered 2020-12-28: 20 mg via INTRAVENOUS

## 2020-12-28 MED ORDER — NITROGLYCERIN 1 MG/10 ML FOR IR/CATH LAB
INTRA_ARTERIAL | Status: AC
  Filled 2020-12-28: qty 10

## 2020-12-28 MED ORDER — HEPARIN SODIUM (PORCINE) 1000 UNIT/ML IJ SOLN
INTRAMUSCULAR | Status: DC | PRN
  Administered 2020-12-28: 2000 [IU] via INTRAVENOUS
  Administered 2020-12-28: 4500 [IU] via INTRAVENOUS
  Administered 2020-12-28: 4000 [IU] via INTRAVENOUS
  Administered 2020-12-28: 2000 [IU] via INTRAVENOUS

## 2020-12-28 MED ORDER — LABETALOL HCL 5 MG/ML IV SOLN
10.0000 mg | INTRAVENOUS | Status: AC | PRN
  Administered 2020-12-28: 10 mg via INTRAVENOUS
  Filled 2020-12-28: qty 4

## 2020-12-28 MED ORDER — AMLODIPINE BESYLATE 5 MG PO TABS
5.0000 mg | ORAL_TABLET | Freq: Every day | ORAL | Status: DC
  Administered 2020-12-28 – 2020-12-29 (×2): 5 mg via ORAL
  Filled 2020-12-28 (×2): qty 1

## 2020-12-28 MED ORDER — ACETAMINOPHEN 325 MG PO TABS: 650.0000 mg | ORAL_TABLET | ORAL | Status: DC | PRN

## 2020-12-28 MED ORDER — CLOPIDOGREL BISULFATE 75 MG PO TABS
75.0000 mg | ORAL_TABLET | Freq: Every day | ORAL | Status: DC
  Administered 2020-12-29: 75 mg via ORAL
  Filled 2020-12-28: qty 1

## 2020-12-28 SURGICAL SUPPLY — 46 items
BALLN EMERGE MR 2.0X8 (BALLOONS) ×2
BALLN EMERGE MR PUSH 1.5X8 (BALLOONS) ×2
BALLN SAPPHIRE ~~LOC~~ 3.5X10 (BALLOONS) ×2 IMPLANT
BALLN SAPPHIRE ~~LOC~~ 3.5X8 (BALLOONS) ×2 IMPLANT
BALLN WOLVERINE 3.25X6 (BALLOONS) ×2
BALLN ~~LOC~~ EMERGE MR 2.75X6 (BALLOONS) ×2
BALLN ~~LOC~~ EUPHORA RX 2.5X8 (BALLOONS) ×2
BALLOON EMERGE MR 2.0X8 (BALLOONS) ×1 IMPLANT
BALLOON EMERGE MR PUSH 1.5X8 (BALLOONS) ×1 IMPLANT
BALLOON WOLVERINE 3.25X6 (BALLOONS) ×1 IMPLANT
BALLOON ~~LOC~~ EMERGE MR 2.75X6 (BALLOONS) ×1 IMPLANT
BALLOON ~~LOC~~ EUPHORA RX 2.5X8 (BALLOONS) ×1 IMPLANT
CATH INFINITI 5 FR AR1 MOD (CATHETERS) ×2 IMPLANT
CATH INFINITI 5FR MULTPACK ANG (CATHETERS) ×2 IMPLANT
CATH OPTICROSS HD (CATHETERS) ×2 IMPLANT
CATH SHOCKWAVE 2.5X12 (CATHETERS) ×1 IMPLANT
CATH TELEPORT (CATHETERS) ×2 IMPLANT
CATH VISTA GUIDE 6FR JL4 (CATHETERS) ×2 IMPLANT
CATHETER SHOCKWAVE 2.5X12 (CATHETERS) ×2
CROWN DIAMONDBACK CLASSIC 1.25 (BURR) ×2 IMPLANT
GLIDESHEATH SLEND A-KIT 6F 22G (SHEATH) ×2 IMPLANT
GUIDEWIRE INQWIRE 1.5J.035X260 (WIRE) ×1 IMPLANT
GUIDEWIRE PRESSURE X 175 (WIRE) ×2 IMPLANT
INQWIRE 1.5J .035X260CM (WIRE) ×2
KIT ENCORE 26 ADVANTAGE (KITS) ×2 IMPLANT
KIT ESSENTIALS PG (KITS) ×2 IMPLANT
KIT HEART LEFT (KITS) ×2 IMPLANT
KIT MICROPUNCTURE NIT STIFF (SHEATH) ×2 IMPLANT
LUBRICANT VIPERSLIDE CORONARY (MISCELLANEOUS) ×2 IMPLANT
PACK CARDIAC CATHETERIZATION (CUSTOM PROCEDURE TRAY) ×2 IMPLANT
SHEATH PINNACLE 5F 10CM (SHEATH) ×2 IMPLANT
SHEATH PINNACLE 6F 10CM (SHEATH) ×2 IMPLANT
SHEATH PROBE COVER 6X72 (BAG) ×4 IMPLANT
SLED PULL BACK IVUS (MISCELLANEOUS) ×2 IMPLANT
STENT SYNERGY XD 3.50X12 (Permanent Stent) ×1 IMPLANT
SYNERGY XD 3.50X12 (Permanent Stent) ×2 IMPLANT
TRANSDUCER W/STOPCOCK (MISCELLANEOUS) ×2 IMPLANT
TUBING CIL FLEX 10 FLL-RA (TUBING) ×2 IMPLANT
WIRE ASAHI GRAND SLAM 180CM (WIRE) ×2 IMPLANT
WIRE ASAHI PROWATER 180CM (WIRE) ×4 IMPLANT
WIRE COUGAR XT STRL 300CM (WIRE) ×2 IMPLANT
WIRE EMERALD 3MM-J .035X150CM (WIRE) ×2 IMPLANT
WIRE GUIDE ASAHI EXTENSION 165 (WIRE) ×2 IMPLANT
WIRE HI TORQ BMW 190CM (WIRE) ×2 IMPLANT
WIRE HI TORQ VERSACORE-J 145CM (WIRE) ×2 IMPLANT
WIRE VIPERWIRE COR FLEX .012 (WIRE) ×2 IMPLANT

## 2020-12-28 NOTE — Progress Notes (Signed)
Site area: right groin  Site Prior to Removal:  Level 0  Pressure Applied For 20 MINUTES    Minutes Beginning at 1440  Manual:   Yes.    Patient Status During Pull:  stable  Post Pull Groin Site:  Level 0  Post Pull Instructions Given:  Yes.    Post Pull Pulses Present:  Yes.    Dressing Applied:  Yes.    Comments:  Bedrest begins at 1500 x 4 hours

## 2020-12-28 NOTE — Progress Notes (Signed)
ANTICOAGULATION CONSULT NOTE - Follow Up Consult  Pharmacy Consult for heparin Indication: chest pain/ACS  Labs: Recent Labs    12/27/20 1334 12/27/20 1534 12/27/20 1648 12/27/20 1747 12/27/20 2050 12/28/20 0107  HGB 15.4*  --   --  14.2  --  13.4  HCT 46.7*  --   --  44.1  --  40.4  PLT 290  --   --  262  --  260  APTT  --   --  33  --   --   --   LABPROT  --   --  15.0  --   --   --   INR  --   --  1.2  --   --   --   HEPARINUNFRC  --   --   --   --   --  <0.10*  CREATININE 0.87  --   --   --   --   --   TROPONINIHS 7 6  --  5 9  --     Assessment/Plan:  87yo female subtherapeutic on heparin though RN found shortly before lab draw that the IV line where heparin was infusing would not flush so site was changed. Will continue gtt at current rate of 600 units/hr for now and check additional level.   Wynona Neat, PharmD, BCPS  12/28/2020,2:00 AM

## 2020-12-28 NOTE — Interval H&P Note (Signed)
History and Physical Interval Note:  12/28/2020 7:37 AM  Melissa Barron  has presented today for surgery, with the diagnosis of chest pain.  The various methods of treatment have been discussed with the patient and family. After consideration of risks, benefits and other options for treatment, the patient has consented to  Procedure(s): LEFT HEART CATH AND CORONARY ANGIOGRAPHY (N/A) as a surgical intervention.  The patient's history has been reviewed, patient examined, no change in status, stable for surgery.  I have reviewed the patient's chart and labs.  Questions were answered to the patient's satisfaction.    2016 Appropriate Use Criteria for Coronary Revascularization in Patients With Acute Coronary Syndrome NSTEMI/UA Intermediate Risk (TIMI Score 3-4) NSTEMI/Unstable angina, stabilized patient at Intermediate Risk (TIMI Score 3-4) Link Here: http://dodson-rose.net/ Indication:  Revascularization by PCI or CABG of 1 or more arteries in a patient with NSTEMI or unstable angina with Stabilization after presentation Intermediate risk for clinical events  A (7) Indication: 16; Score 7    [place]

## 2020-12-29 ENCOUNTER — Other Ambulatory Visit (HOSPITAL_COMMUNITY): Payer: Medicare HMO

## 2020-12-29 LAB — BASIC METABOLIC PANEL
Anion gap: 8 (ref 5–15)
BUN: 11 mg/dL (ref 8–23)
CO2: 24 mmol/L (ref 22–32)
Calcium: 8.1 mg/dL — ABNORMAL LOW (ref 8.9–10.3)
Chloride: 105 mmol/L (ref 98–111)
Creatinine, Ser: 0.74 mg/dL (ref 0.44–1.00)
GFR, Estimated: >60 mL/min (ref >60)
Glucose, Bld: 95 mg/dL (ref 70–99)
Potassium: 4 mmol/L (ref 3.5–5.1)
Sodium: 137 mmol/L (ref 135–145)

## 2020-12-29 LAB — CBC
HCT: 39.2 % (ref 36.0–46.0)
Hemoglobin: 12.9 g/dL (ref 12.0–15.0)
MCH: 33.9 pg (ref 26.0–34.0)
MCHC: 32.9 g/dL (ref 30.0–36.0)
MCV: 103.2 fL — ABNORMAL HIGH (ref 80.0–100.0)
Platelets: 234 10*3/uL (ref 150–400)
RBC: 3.8 MIL/uL — ABNORMAL LOW (ref 3.87–5.11)
RDW: 14.6 % (ref 11.5–15.5)
WBC: 6.2 10*3/uL (ref 4.0–10.5)
nRBC: 0 % (ref 0.0–0.2)

## 2020-12-29 MED ORDER — NITROGLYCERIN 0.4 MG SL SUBL: 0.4000 mg | SUBLINGUAL_TABLET | SUBLINGUAL | 3 refills | Status: DC | PRN

## 2020-12-29 MED ORDER — AMLODIPINE BESYLATE 10 MG PO TABS: 10.0000 mg | ORAL_TABLET | Freq: Every day | ORAL | 3 refills | Status: DC

## 2020-12-29 NOTE — Discharge Summary (Signed)
Physician Discharge Summary  Patient ID: Melissa Barron MRN: [id number] DOB/AGE: 11-02-1930 87 y.o. Melissa Boston, MD   Admit date: 12/27/2020 Discharge date: 12/29/2020  Primary Discharge Diagnosis 1.  Coronary artery disease of the native vessel with unstable angina 2.  Moderate aortic stenosis and moderate aortic regurgitation by echocardiogram in September 2021, also by cardiac catheterization yesterday. 3.  Primary hypertension 4.  Hypercholesterolemia 5.  COPD with centrilobular emphysema and bronchiectasis 6.  Polycythemia on chronic hydroxyurea 7.  Asymptomatic bilateral carotid artery stenosis  Cardiac Studies:   Lexiscan Myoview stress test 08/18/2018: 1. The patient underwent Regadenoson stress testing due to non-cardiac physical limitations.  2. The TID ratio was 0.71  3. The post stress ejection fraction is measured at 69%.  4. Normal left ventricular wall motion and segmental function.  5. Normal myocardial perfusion study without evidence of inducible ischemia.    Echocardiogram 06/26/2020:  Left ventricle cavity is normal in size. Moderate concentric hypertrophy of the left ventricle. Normal global wall motion. Normal LV systolic  function with EF 55%. Doppler evidence of grade I (impaired) diastolic dysfunction, normal LAP. Calculated EF 55%.  Left atrial cavity is moderately dilated. Aneurysmal interatrial septum without 2D or color Doppler evidence of shunting.  Aortic valve is probably bileaflet with mild calcificaion. Moderate aortic stenosis. Vmax 3.1 m/sec, mean PG 25 mmHg, AVA 0.8 cm2, dimensionless  index 0.36. Moderate (Grade II) aortic regurgitation. Mild mitral valve leaflet calcification. Mildly restricted mitral valve leaflets. Moderate (Grade II) mitral regurgitation.  Moderate tricuspid regurgitation. Estimated pulmonary artery systolic pressure 39 mmHg.   Carotid artery duplex 06/26/2020:  Doppler velocity suggests stenosis in the right internal  carotid artery  (16-49%). Stenosis in the right external carotid artery (<50%).  Doppler velocity suggests stenosis in the left internal carotid artery  (50-69%). Stenosis in the left external carotid artery (<50%).  Antegrade right vertebral artery flow. Antegrade left vertebral artery flow.  Follow up in six months is appropriate if clinically indicated.   Left Heart Catheterization 12/28/2020:  LM: Distal 20% stenosis LAD: Tortuous vessel. Prox-mid prior stent with focal 80% ISR. Attempted orbital atherectomy.   Shockwave lithotripsy PTCA and stent placement 3.5 X 12 mm Synergy drug-eluting stent  Intravascular ultrasound (IVUS) MSA 7.1 mm2.  80%-->0% residual stenosis. TIMI flow II-->III LCx: Tortuous vessel  Mid focal 40% stenosis RCA: Prox-mid prior stent Mid 40% ISR RV marginal branch with prox 90% diffuse disease LV-Ao Peak-to-peak gradient 21 mmHg,  LV-Ao Mean PG 20 mmHg. Moderate aortic stenosis.  EKG:   EKG 12/29/2020: Normal sinus rhythm at rate of 68 bpm, leftward axis, inferior and anteroseptal T wave abnormality, subendocardial infarct versus ischemia.  EKG 12/27/2020: Serial EKGs evaluated.  Initial EKG reveals ST elevation in anterior leads, inferior and anterolateral T wave inversions, final EKG reveals normal sinus rhythm.   Radiology:   PFT 01/28/2018: Flow and volume loops show coving.  Spirometry shows only mild airway obstruction pre bronchodilator challenge which resolved post bronchodilator challenge.  There is no significant bronchodilator response.  Lung volumes are normal.  Carbon monoxide diffusion capacity uncorrected for hemoglobin is moderately decreased.  CT scan chest 02/10/2018: PANLOBULAR EMPHYSEMA WITH BILATERAL UPPER AND LOWER LOBE  SUBPLEURAL INTERSTITIAL THICKENING. SUBTLE AREAS OF  BRONCHIECTASIS IN THE LUNG APICES AND LUNG BASES. NO ACUTE PROCESS.   DG Chest Port 1 View Result Date: 12/27/2020 CLINICAL DATA:  Chest pain and shortness of  breath. EXAM: PORTABLE CHEST 1 VIEW COMPARISON:  None. FINDINGS: Heart size at the  upper limits of normal, accentuated by technique. Atherosclerotic calcification of the aortic arch. Diffusely coarsened interstitial markings with emphysematous changes. No focal consolidation, pleural effusion, or pneumothorax. No acute osseous abnormality. IMPRESSION: 1. No active disease. 2. Chronic interstitial lung disease, likely smoking-related. Electronically Signed   By: Titus Dubin M.D.   On: 12/27/2020 15:16    Hospital Course: Melissa Barron is a 87 y.o. female  patient who is fairly independent with hypertension, hyperlipidemia, COPD, GERD, polycythemia vera on chronic hydroxyurea therapy, coronary disease with history of myocardial infarction in 2018 was seen by my partner Dr. Virgina Jock on 12/26/2020 for exertional chest pain and dyspnea on exertion class 3-4.  He had recommended that she come to the emergency room if she has recurrence of chest pain as she had markedly abnormal EKG suggestive of a very large inferior lateral lateral subendocardial infarct.  She presented the following day on 12/27/2020 with chest discomfort and markedly abnormal EKG which normalized upon presentation.  She was admitted to the hospital and the following morning underwent cardiac catheterization complex angioplasty to the proximal LAD.  She had no postprocedural complications, kept overnight in view of her advanced age and complex procedure, this morning feels well without any recurrence of chest pain, labs stable and felt appropriate for discharge home.  Recommendations on discharge: Patient will be discharged home on high intensity statins, dual antiplatelet therapy with aspirin and Plavix and should be continued on beta-blocker and also calcium channel blocker for hypertension.  Angina pectoris.  Sublingual nitroglycerin also prescribed.  She will be followed up in the office for close monitoring on 01/03/2021.  She will need OP  echocardiogram and will set this up. IP cancelled as still not done and will not change therapy.  Discharge Exam: Vitals with BMI 12/29/2020 12/29/2020 12/29/2020  Height - - -  Weight - 111 lbs 6 oz -  BMI - 40.37 -  Systolic 543 606 770  Diastolic 45 70 57  Pulse 65 69 69     Physical Exam Constitutional:      Appearance: She is underweight. She is not ill-appearing.  Eyes:     Extraocular Movements: Extraocular movements intact.  Neck:     Vascular: No carotid bruit or JVD.  Cardiovascular:     Rate and Rhythm: Normal rate and regular rhythm.     Pulses: Normal pulses and intact distal pulses.     Heart sounds: Murmur heard.   Harsh midsystolic murmur is present with a grade of 3/6 at the upper right sternal border radiating to the neck. No gallop.   Pulmonary:     Effort: Pulmonary effort is normal.     Breath sounds: Examination of the right-lower field reveals rhonchi. Examination of the left-lower field reveals rhonchi. Rhonchi present.  Abdominal:     General: Bowel sounds are normal.     Palpations: Abdomen is soft.  Musculoskeletal:        General: No swelling.     Cervical back: Normal range of motion.  Skin:    General: Skin is warm and dry.  Neurological:     General: No focal deficit present.     Mental Status: She is alert and oriented to person, place, and time.    Labs:   Lab Results  Component Value Date   WBC 6.2 12/29/2020   HGB 12.9 12/29/2020   HCT 39.2 12/29/2020   MCV 103.2 (H) 12/29/2020   PLT 234 12/29/2020    Recent Labs  Lab  12/29/20 0400  NA 137  K 4.0  CL 105  CO2 24  BUN 11  CREATININE 0.74  CALCIUM 8.1*  GLUCOSE 95    Lipid Panel     Component Value Date/Time   CHOL 122 12/27/2020 1534   TRIG 74 12/27/2020 1534   HDL 42 12/27/2020 1534   CHOLHDL 2.9 12/27/2020 1534   VLDL 15 12/27/2020 1534   LDLCALC 65 12/27/2020 1534    BNP (last 3 results) Recent Labs    12/27/20 1334  BNP 524.7*    HEMOGLOBIN A1C No  results found for: HGBA1C, MPG  Cardiac Panel   Negative for myocardial injury  TSH No results for input(s): TSH in the last 8760 hours.  FOLLOW UP PLANS AND APPOINTMENTS Discharge Instructions    Amb Referral to Cardiac Rehabilitation   Complete by: As directed    Diagnosis:  Coronary Stents PTCA     After initial evaluation and assessments completed: Virtual Based Care may be provided alone or in conjunction with Phase 2 Cardiac Rehab based on patient barriers.: Yes   Diet - low sodium heart healthy   Complete by: As directed    Increase activity slowly   Complete by: As directed      Allergies as of 12/29/2020      Reactions   Latex Itching, Rash   Other reaction(s): Itching,Rash Red       Medication List    TAKE these medications   amLODipine 10 MG tablet Commonly known as: NORVASC Take 1 tablet (10 mg total) by mouth daily. What changed:   medication strength  how much to take   aspirin EC 81 MG tablet Take 1 tablet (81 mg total) by mouth daily. Swallow whole.   atorvastatin 80 MG tablet Commonly known as: LIPITOR Take 1 tablet by mouth daily.   carvedilol 6.25 MG tablet Commonly known as: COREG Take 2 tablets (12.5 mg total) by mouth 2 (two) times daily.   clopidogrel 75 MG tablet Commonly known as: Plavix Take 1 tablet (75 mg total) by mouth daily.   denosumab 60 MG/ML Sosy injection Commonly known as: PROLIA Inject 60 mg into the skin every 6 (six) months.   hydroxyurea 500 MG capsule Commonly known as: HYDREA Take 1 capsule (500 mg total) by mouth daily.   irbesartan 300 MG tablet Commonly known as: AVAPRO Take 1 tablet (300 mg total) by mouth at bedtime.   isosorbide mononitrate 30 MG 24 hr tablet Commonly known as: IMDUR Take 1 tablet (30 mg total) by mouth daily.   nitroGLYCERIN 0.4 MG SL tablet Commonly known as: NITROSTAT Place 1 tablet (0.4 mg total) under the tongue every 5 (five) minutes x 3 doses as needed for chest pain.    pantoprazole 40 MG tablet Commonly known as: PROTONIX Take 1 tablet (40 mg total) by mouth daily.   sertraline 100 MG tablet Commonly known as: ZOLOFT Take 1 tablet by mouth daily.   Spiriva Respimat 2.5 MCG/ACT Aers Generic drug: Tiotropium Bromide Monohydrate Inhale 2 puffs into the lungs daily.   Vitamin D3 250 MCG (10000 UT) capsule Take 1 capsule (10,000 Units total) by mouth daily.       Follow-up Information    Nigel Mormon, MD Follow up on 01/03/2021.   Specialties: Cardiology, Radiology Why: 1:30 PM. Please bring all medications Contact information: [place] [address] [phone]  Adrian Prows, MD, [facility] 12/29/2020, 11:09 AM Office: [phone]

## 2020-12-29 NOTE — Progress Notes (Signed)
CARDIAC REHAB PHASE I   PRE:  Rate/Rhythm: 70 SR    BP: sitting 154/70    SaO2: 80 RA after washing up, 902L  MODE:  Ambulation: 190 ft   POST:  Rate/Rhythm: 70 SR    BP: sitting 188/62     SaO2: 89 2L  Pt up at sink on my arrival. Sts she feels good however SaO2 80 RA. Applied 2L and up to 90 2L. She sts she is supposed to wear 2L at home but chooses not to unless she feels like she needs it. She is adamant she will not wear it out of the house. [employer], fairly steady but rest x2 for SOB. To recliner. Pt intermittently c/o sharp pain in back to chest.   Discussed importance of Plavix, stent, restrictions, and CRPII. Pt sts she has never had NTG. Did not give formal exercise guidelines or diet. Will refer to [place] as pt enjoyed the program previously. [place], ACSM 12/29/2020 8:32 AM

## 2020-12-31 ENCOUNTER — Encounter (HOSPITAL_COMMUNITY): Payer: Self-pay | Admitting: Cardiology

## 2020-12-31 ENCOUNTER — Other Ambulatory Visit: Payer: Medicare HMO

## 2020-12-31 ENCOUNTER — Telehealth: Payer: Self-pay

## 2020-12-31 DIAGNOSIS — R0609 Other forms of dyspnea: Secondary | ICD-10-CM | POA: Diagnosis not present

## 2020-12-31 DIAGNOSIS — I25118 Atherosclerotic heart disease of native coronary artery with other forms of angina pectoris: Secondary | ICD-10-CM | POA: Diagnosis not present

## 2020-12-31 NOTE — Telephone Encounter (Signed)
Location of hospitalization: [facility] Reason for hospitalization: Unstable angina Date of discharge: 12/29/20 Date of first communication with patient: today Person contacting patient: Me Current symptoms:  Do you understand why you were in the Hospital: Yes Questions regarding discharge instructions: None Where were you discharged to: Home Medications reviewed: Yes Allergies reviewed: Yes Dietary changes reviewed: Yes. Discussed low fat and low salt diet.  Referals reviewed: NA Activities of Daily Living: Able to with mild limitations Any transportation issues/concerns: None Any patient concerns: None Confirmed importance & date/time of Follow up appt: Yes Confirmed with patient if condition begins to worsen call. Pt was given the office number and encouraged to call back with questions or concerns: Yes   Called patient, NA, LMAM

## 2021-01-01 ENCOUNTER — Other Ambulatory Visit: Payer: Medicare HMO

## 2021-01-01 ENCOUNTER — Ambulatory Visit: Payer: Medicare HMO

## 2021-01-01 ENCOUNTER — Other Ambulatory Visit: Payer: Self-pay

## 2021-01-01 DIAGNOSIS — R0600 Dyspnea, unspecified: Secondary | ICD-10-CM

## 2021-01-01 DIAGNOSIS — I25118 Atherosclerotic heart disease of native coronary artery with other forms of angina pectoris: Secondary | ICD-10-CM | POA: Diagnosis not present

## 2021-01-01 DIAGNOSIS — R0609 Other forms of dyspnea: Secondary | ICD-10-CM | POA: Diagnosis not present

## 2021-01-01 NOTE — Telephone Encounter (Signed)
Location of hospitalization: [facility] Reason for hospitalization: Shortness of breath, Diagnosis : Unstable angina Date of discharge: 12/29/20 Date of first communication with patient: today Person contacting patient: Gaye Alken, CMA Current symptoms: just fatigued, mild chest pain Do you understand why you were in the Hospital: Yes Questions regarding discharge instructions: None Where were you discharged to: Home Medications reviewed: Yes Allergies reviewed: Yes Dietary changes reviewed: Yes. Discussed low fat and low salt diet.  Referals reviewed: NA Activities of Daily Living: Able to with mild limitations Any transportation issues/concerns: None Any patient concerns: None Confirmed importance & date/time of Follow up appt: Yes Confirmed with patient if condition begins to worsen call. Pt was given the office number and encouraged to call back with questions or concerns: Yes

## 2021-01-02 NOTE — Progress Notes (Signed)
Patient Care Team: [name], MD as PCP - General (Internal Medicine)  DIAGNOSIS:    ICD-10-CM   1. Polycythemia vera ([place])  D45     CHIEF COMPLIANT: Follow-up of polycythemia vera  INTERVAL HISTORY: Melissa Barron is a 87 y.o. 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 She was admitted from 12/27/20-12/29/20 following evaluation for exertional chest pain and dyspnea on exertion and underwent cardiac catheterization complex angioplasty. She presents to the clinic today for follow-up.   ALLERGIES:  is allergic to latex.  MEDICATIONS:  Current Outpatient Medications  Medication Sig Dispense Refill  . amLODipine (NORVASC) 10 MG tablet Take 1 tablet (10 mg total) by mouth daily. 90 tablet 3  . aspirin EC 81 MG tablet Take 1 tablet (81 mg total) by mouth daily. Swallow whole. 90 tablet 3  . atorvastatin (LIPITOR) 80 MG tablet Take 1 tablet by mouth daily.    . carvedilol (COREG) 6.25 MG tablet Take 2 tablets (12.5 mg total) by mouth 2 (two) times daily. 1 tablet 0  . Cholecalciferol (VITAMIN D3) 250 MCG (10000 UT) capsule Take 1 capsule (10,000 Units total) by mouth daily.    . clopidogrel (PLAVIX) 75 MG tablet Take 1 tablet (75 mg total) by mouth daily. 30 tablet 1  . denosumab (PROLIA) 60 MG/ML SOSY injection Inject 60 mg into the skin every 6 (six) months.    . hydroxyurea (HYDREA) 500 MG capsule Take 1 capsule (500 mg total) by mouth daily. 90 capsule 3  . irbesartan (AVAPRO) 300 MG tablet Take 1 tablet (300 mg total) by mouth at bedtime. 90 tablet 3  . isosorbide mononitrate (IMDUR) 30 MG 24 hr tablet Take 1 tablet (30 mg total) by mouth daily. 90 tablet 3  . nitroGLYCERIN (NITROSTAT) 0.4 MG SL tablet Place 1 tablet (0.4 mg total) under the tongue every 5 (five) minutes x 3 doses as needed for chest pain. 25 tablet 3  . pantoprazole (PROTONIX) 40 MG tablet Take 1 tablet (40 mg total) by mouth daily. 90 tablet 3  . sertraline (ZOLOFT)  100 MG tablet Take 1 tablet by mouth daily.    . Tiotropium Bromide Monohydrate (SPIRIVA RESPIMAT) 2.5 MCG/ACT AERS Inhale 2 puffs into the lungs daily.     No current facility-administered medications for this visit.    PHYSICAL EXAMINATION: ECOG PERFORMANCE STATUS: 1 - Symptomatic but completely ambulatory  Vitals:   01/03/21 1058  BP: (!) 142/59  Pulse: 70  Resp: 20  Temp: (!) 97.2 F (36.2 C)   Filed Weights   01/03/21 1058  Weight: 113 lb 14.4 oz (51.7 kg)    LABORATORY DATA:  I have reviewed the data as listed CMP Latest Ref Rng & Units 12/29/2020 12/27/2020  Glucose 70 - 99 mg/dL 95 95  BUN 8 - 23 mg/dL 11 20  Creatinine 0.44 - 1.00 mg/dL 0.74 0.87  Sodium 135 - 145 mmol/L 137 141  Potassium 3.5 - 5.1 mmol/L 4.0 4.3  Chloride 98 - 111 mmol/L 105 106  CO2 22 - 32 mmol/L 24 27  Calcium 8.9 - 10.3 mg/dL 8.1(L) 8.8(L)    Lab Results  Component Value Date   WBC 7.3 01/03/2021   HGB 13.7 01/03/2021   HCT 42.4 01/03/2021   MCV 103.9 (H) 01/03/2021   PLT 254 01/03/2021   NEUTROABS 5.8 01/03/2021    ASSESSMENT & PLAN:  Polycythemia vera ([place]) Diagnosed at [facility] Current treatment: Hydroxyurea 500 mg daily History of  COPD, depression, hypertension, hypercholesterolemia, osteoporosis, GERD, CAD Hydroxyurea toxicities: None: She is tolerating it extremely well.  Lab review: 05/11/2020: Hemoglobin 14.3, WBC 7.9, MCV 106.8, platelets 260 01/03/2021: Hemoglobin 13.7, WBC 7.3, MCV 103.9, platelets 254  The macrocytosis indicates that the patient is compliant on hydroxyurea. I decreased the dosage of Hydrea to 5 days a week starting 01/03/2021.  Hospitalization for coronary artery disease  I agree with continuing the current treatment. Return to clinic every 6 months with labs and follow-up.    No orders of the defined types were placed in this encounter.  The patient has a good understanding of the overall plan. she agrees with it. she will call with any  problems that may develop before the next visit here.  Total time spent: 20 mins including face to face time and time spent for planning, charting and coordination of care  Rulon Eisenmenger, MD, MPH 01/03/2021  I, Cloyde Reams Dorshimer, am acting as scribe for Dr. Nicholas Lose.  I have reviewed the above documentation for accuracy and completeness, and I agree with the above.

## 2021-01-02 NOTE — Assessment & Plan Note (Signed)
Diagnosed at [facility] Current treatment: Hydroxyurea 500 mg daily History of COPD, depression, hypertension, hypercholesterolemia, osteoporosis, GERD, CAD Hydroxyurea toxicities: None: She is tolerating it extremely well.  Lab review: 05/11/2020: Hemoglobin 14.3, WBC 7.9, MCV 106.8, platelets 260 The macrocytosis indicates that the patient is compliant on hydroxyurea.  I agree with continuing the current treatment. Return to clinic every 6 months with labs and follow-up.

## 2021-01-03 ENCOUNTER — Telehealth (HOSPITAL_COMMUNITY): Payer: Self-pay

## 2021-01-03 ENCOUNTER — Ambulatory Visit: Payer: Medicare HMO | Admitting: Cardiology

## 2021-01-03 ENCOUNTER — Inpatient Hospital Stay: Payer: Medicare HMO | Attending: Hematology and Oncology | Admitting: Hematology and Oncology

## 2021-01-03 ENCOUNTER — Other Ambulatory Visit (HOSPITAL_COMMUNITY): Payer: Self-pay

## 2021-01-03 ENCOUNTER — Other Ambulatory Visit: Payer: Self-pay

## 2021-01-03 ENCOUNTER — Encounter: Payer: Self-pay | Admitting: Cardiology

## 2021-01-03 ENCOUNTER — Inpatient Hospital Stay: Payer: Medicare HMO

## 2021-01-03 VITALS — BP 135/46 | HR 68 | Temp 98.6°F | Resp 16 | Ht 60.0 in | Wt 114.0 lb

## 2021-01-03 DIAGNOSIS — I10 Essential (primary) hypertension: Secondary | ICD-10-CM

## 2021-01-03 DIAGNOSIS — M810 Age-related osteoporosis without current pathological fracture: Secondary | ICD-10-CM | POA: Insufficient documentation

## 2021-01-03 DIAGNOSIS — D45 Polycythemia vera: Secondary | ICD-10-CM | POA: Diagnosis not present

## 2021-01-03 DIAGNOSIS — I2510 Atherosclerotic heart disease of native coronary artery without angina pectoris: Secondary | ICD-10-CM | POA: Insufficient documentation

## 2021-01-03 DIAGNOSIS — Z79899 Other long term (current) drug therapy: Secondary | ICD-10-CM | POA: Diagnosis not present

## 2021-01-03 DIAGNOSIS — K219 Gastro-esophageal reflux disease without esophagitis: Secondary | ICD-10-CM | POA: Insufficient documentation

## 2021-01-03 DIAGNOSIS — Z7982 Long term (current) use of aspirin: Secondary | ICD-10-CM | POA: Insufficient documentation

## 2021-01-03 DIAGNOSIS — E7800 Pure hypercholesterolemia, unspecified: Secondary | ICD-10-CM | POA: Diagnosis not present

## 2021-01-03 DIAGNOSIS — J449 Chronic obstructive pulmonary disease, unspecified: Secondary | ICD-10-CM | POA: Diagnosis not present

## 2021-01-03 DIAGNOSIS — I25118 Atherosclerotic heart disease of native coronary artery with other forms of angina pectoris: Secondary | ICD-10-CM

## 2021-01-03 LAB — CBC WITH DIFFERENTIAL (CANCER CENTER ONLY)
Abs Immature Granulocytes: 0.02 10*3/uL (ref 0.00–0.07)
Basophils Absolute: 0.1 10*3/uL (ref 0.0–0.1)
Basophils Relative: 2 %
Eosinophils Absolute: 0.2 10*3/uL (ref 0.0–0.5)
Eosinophils Relative: 2 %
HCT: 42.4 % (ref 36.0–46.0)
Hemoglobin: 13.7 g/dL (ref 12.0–15.0)
Immature Granulocytes: 0 %
Lymphocytes Relative: 11 %
Lymphs Abs: 0.8 10*3/uL (ref 0.7–4.0)
MCH: 33.6 pg (ref 26.0–34.0)
MCHC: 32.3 g/dL (ref 30.0–36.0)
MCV: 103.9 fL — ABNORMAL HIGH (ref 80.0–100.0)
Monocytes Absolute: 0.5 10*3/uL (ref 0.1–1.0)
Monocytes Relative: 7 %
Neutro Abs: 5.8 10*3/uL (ref 1.7–7.7)
Neutrophils Relative %: 78 %
Platelet Count: 254 10*3/uL (ref 150–400)
RBC: 4.08 MIL/uL (ref 3.87–5.11)
RDW: 14.2 % (ref 11.5–15.5)
WBC Count: 7.3 10*3/uL (ref 4.0–10.5)
nRBC: 0 % (ref 0.0–0.2)

## 2021-01-03 MED ORDER — HYDROXYUREA 500 MG PO CAPS: 500.0000 mg | ORAL_CAPSULE | Freq: Every day | ORAL | 3 refills | Status: DC

## 2021-01-03 MED ORDER — BISOPROLOL FUMARATE 5 MG PO TABS: 5.0000 mg | ORAL_TABLET | Freq: Every day | ORAL | 1 refills | Status: DC

## 2021-01-03 MED ORDER — CARVEDILOL 25 MG PO TABS: 25.0000 mg | ORAL_TABLET | Freq: Two times a day (BID) | ORAL | 2 refills | Status: DC

## 2021-01-03 NOTE — Progress Notes (Signed)
Patient referred by [name], MD for coronary artery disease  Subjective:   Melissa Barron, female    DOB: [birth year], 87 y.o.   MRN: [id number]   Chief Complaint  Patient presents with  . Coronary Artery Disease  . Hospitalization Follow-up  . Hypertension  . Results    echo    87 y.o. Caucasian female with hypertension, hyperlipidemia, CAD, COPD, depression, GERD, exertional dyspnea with hypoxia.   Patient underwent complex PCI to mid LAD ISR, given her presentation with unstable angina. She has had only 2 episodes of chest pain requiring NTG, none this week. However, she continues to have exertional dyspnea, with documented hypoxia in mid to high 80s. She does have portable oxygen at home, but does not use it regularly. She does have baseline COPD, with CXR pn 12/26/2020 showing chronic interstitial markings. Echocardiogram today reviewed with the patient, details below.    Current Outpatient Medications on File Prior to Visit  Medication Sig Dispense Refill  . amLODipine (NORVASC) 10 MG tablet Take 1 tablet (10 mg total) by mouth daily. 90 tablet 3  . aspirin EC 81 MG tablet Take 1 tablet (81 mg total) by mouth daily. Swallow whole. 90 tablet 3  . atorvastatin (LIPITOR) 80 MG tablet Take 1 tablet by mouth daily.    . carvedilol (COREG) 6.25 MG tablet Take 2 tablets (12.5 mg total) by mouth 2 (two) times daily. 1 tablet 0  . Cholecalciferol (VITAMIN D3) 250 MCG (10000 UT) capsule Take 1 capsule (10,000 Units total) by mouth daily.    . clopidogrel (PLAVIX) 75 MG tablet Take 1 tablet (75 mg total) by mouth daily. 30 tablet 1  . denosumab (PROLIA) 60 MG/ML SOSY injection Inject 60 mg into the skin every 6 (six) months.    . hydroxyurea (HYDREA) 500 MG capsule Take 1 capsule (500 mg total) by mouth daily. 90 capsule 3  . irbesartan (AVAPRO) 300 MG tablet Take 1 tablet (300 mg total) by mouth at bedtime. 90 tablet 3  . isosorbide mononitrate (IMDUR) 30 MG 24 hr tablet Take 1  tablet (30 mg total) by mouth daily. 90 tablet 3  . nitroGLYCERIN (NITROSTAT) 0.4 MG SL tablet Place 1 tablet (0.4 mg total) under the tongue every 5 (five) minutes x 3 doses as needed for chest pain. 25 tablet 3  . pantoprazole (PROTONIX) 40 MG tablet Take 1 tablet (40 mg total) by mouth daily. 90 tablet 3  . sertraline (ZOLOFT) 100 MG tablet Take 1 tablet by mouth daily.    . Tiotropium Bromide Monohydrate (SPIRIVA RESPIMAT) 2.5 MCG/ACT AERS Inhale 2 puffs into the lungs daily.     No current facility-administered medications on file prior to visit.    Cardiovascular and other pertinent studies:  EKG 01/03/2021: Sinus rhythm 68 bpm Anterior infarct, age indeterminate Inferior T wave inversion, consider iscehmia   Echocardiogram 01/03/2021: Left ventricle cavity is normal in size. Moderate concentric hypertrophy of the left ventricle. Normal global wall motion. Normal LV systolic function with EF 63%. Doppler evidence of grade I (impaired) diastolic dysfunction, normal LAP.  Left atrial cavity is moderately dilated. Trileaflet aortic valve with moderate calcification. Transvalvular mean PG 28 mmHg, max PG 44 mmHg, likely originating predominantly from LVOT than true valvular stenosis.  Moderate (grade II) aortic regurgitation. Mild tricuspid regurgitation. Estimated pulmonary artery systolic pressure 37 mmHg.  No significant change compared to previous study on 06/26/2020.   Coronary intervention 12/28/2020: LM: Distal 20% stenosis LAD: Tortuous vessel  Prox-mid prior stent with focal 80% ISR        Attempted orbital atherectomy        Shockwave lithotripsy        PTCA and stent placement 3.5 X 12 mm Synergy drug-eluting stent        Intravascular ultrasound (IVUS) MSA 7.1 mm2        80%-->0% residual stenosis        TIMI flow II-->III LCx: Tortuous vessel        Mid focal 40% stenosis RCA: Prox-mid prior stent         Mid 40% ISR         RV marginal branch with prox 90%  diffuse disease  LV-Ao Peak-to-peak gradient 21 mmHg LV-Ao Mean PG 20 mmHg Moderate aortic stenosis   Carotid artery duplex 06/26/2020:  Doppler velocity suggests stenosis in the right internal carotid artery  (16-49%). Stenosis in the right external carotid artery (<50%).  Doppler velocity suggests stenosis in the left internal carotid artery  (50-69%). Stenosis in the left external carotid artery (<50%).  Antegrade right vertebral artery flow. Antegrade left vertebral artery  flow.  Follow up in six months is appropriate if clinically indicated.  Recent labs: 05/12/2020: Glucose 80, BUN/Cr 17/0.7. EGFR 79. Na/K 137/4.1. Rest of the CMP normal H/H 14/44. MCV 106. Platelets 280 HbA1C N/A Chol 124, TG 106, HDL 42, LDL 61 TSH N/A    Review of Systems  Cardiovascular: Positive for dyspnea on exertion. Negative for chest pain, leg swelling, palpitations and syncope.  Respiratory: Positive for shortness of breath.          Vitals:   01/03/21 1333  BP: (!) 135/46  Pulse: 68  Resp: 16  Temp: 98.6 F (37 C)  SpO2: (!) 87%    Body mass index is 22.26 kg/m. Filed Weights   01/03/21 1333  Weight: 114 lb (51.7 kg)     Objective:   Physical Exam Vitals and nursing note reviewed.  Constitutional:      General: She is not in acute distress. Neck:     Vascular: No JVD.  Cardiovascular:     Rate and Rhythm: Normal rate and regular rhythm.     Pulses:          Carotid pulses are on the right side with bruit.    Heart sounds: Murmur heard.   Harsh midsystolic murmur is present with a grade of 3/6 at the upper right sternal border radiating to the neck.   Pulmonary:     Effort: Pulmonary effort is normal.     Breath sounds: Normal breath sounds. No wheezing or rales.  Musculoskeletal:     Right lower leg: No edema.         Assessment & Recommendations:   87 y.o. Caucasian female with hypertension, hyperlipidemia, CAD, COPD, depression, GERD, exertional  dyspnea with hypoxia.   CAD: Successful complex PCI for unstable angina (11/2020) Continue DAPT with Aspirin, plavix at least till 11/2021 Conitnue beta blocker, statin. I suspect her NTG use will now reduce.   Exertional dyspnea, hypoxia: Normal EF, no wall motional abnormality, only grade I diastolic dysfunction. No florid heart failure on exam. She does have mild LVOT obstruction with hyperdynamic LV with only mild aortic stenosis. I will change her low dose coreg to more B1 selective bisoprolol 5 mg. However I suspect her hypoxia is out of proportion to any cardiac abnormality. Encourage home oxygen use. I will seek input from her pulmonologist Dr.  Hunsucker.   Hypertension: Controlled.   F/u in 4 weeks   Nigel Mormon, MD Pager: [phone] Office: [phone]

## 2021-01-03 NOTE — Telephone Encounter (Cosign Needed)
Pharmacy Transitions of Care Follow-up Telephone Call  Date of discharge: 12/28/20 Discharge Diagnosis: Coronary artery disease of the native vessel with unstable angina  How have you been since you were released from the hospital? Patient reported no concerns   Medication changes made at discharge:  - START: clopidogrel 75 mg PO daily; nitroglycerin 0.4 mg SL PRN  - CHANGED: amlodipine 10 mg PO daily   Medication changes verified by the patient? Y    Medication Accessibility:  Home Pharmacy: [facility] mail order   Was the patient provided with refills on discharged medications? Y   Have all prescriptions been transferred from [facility] to home pharmacy? No, patient discussed with provider  Medication Review: CLOPIDOGREL (PLAVIX) Clopidogrel 75 mg once daily.  - Reviewed potential DDIs with patient  - Advised patient of medications to avoid (NSAIDs, ASA)  - Educated that Tylenol (acetaminophen) will be the preferred analgesic to prevent risk of bleeding  - Emphasized importance of monitoring for signs and symptoms of bleeding (abnormal bruising, prolonged bleeding, nose bleeds, bleeding from gums, discolored urine, black tarry stools)  - Advised patient to alert all providers of anticoagulation therapy prior to starting a new medication or having a procedure   Follow-up Appointments:   [facility] f/u appt confirmed? Y Scheduled to see Vernell Leep on 01/03/21 @ [facility], patient completed appointment by time of call.   If their condition worsens, is the pt aware to call PCP or go to the Emergency Dept.? Y  Final Patient Assessment: Patient reports no issues with new clopidogrel. All patient questions were answered. No issues were raised at patient appointment today with cardiology. Patient has discussed process for getting refills transferred to [facility] mail order pharmacy.  Brenton Grills PharmD Candidate  01/03/2021

## 2021-01-03 NOTE — Telephone Encounter (Signed)
Pt insurance is active and benefits verified through [facility]. Co-pay $10.00, DED $0.00/$0.00 met, out of pocket $3,900.00/$342.47 met, co-insurance 0%. No pre-authorization required. Passport, 01/03/21 @ 3:40PM, [id number]  Will contact patient to see if she is interested in the Cardiac Rehab Program. If interested, patient will need to complete follow up appt. Once completed, patient will be contacted for scheduling upon review by the RN Navigator.

## 2021-01-03 NOTE — Telephone Encounter (Signed)
Called patient to see if she is interested in the Cardiac Rehab Program. Patient expressed interest. Explained scheduling process and went over insurance, patient verbalized understanding. Will contact patient for scheduling once f/u has been completed.

## 2021-01-04 ENCOUNTER — Encounter: Payer: Self-pay | Admitting: Cardiology

## 2021-01-07 ENCOUNTER — Other Ambulatory Visit: Payer: Medicare HMO

## 2021-01-08 ENCOUNTER — Other Ambulatory Visit: Payer: Medicare HMO

## 2021-01-08 ENCOUNTER — Other Ambulatory Visit: Payer: Self-pay

## 2021-01-08 ENCOUNTER — Telehealth: Payer: Self-pay

## 2021-01-08 DIAGNOSIS — I10 Essential (primary) hypertension: Secondary | ICD-10-CM

## 2021-01-08 DIAGNOSIS — I25118 Atherosclerotic heart disease of native coronary artery with other forms of angina pectoris: Secondary | ICD-10-CM

## 2021-01-08 MED ORDER — CLOPIDOGREL BISULFATE 75 MG PO TABS: 75.0000 mg | ORAL_TABLET | Freq: Every day | ORAL | 1 refills | Status: DC

## 2021-01-08 MED ORDER — BISOPROLOL FUMARATE 5 MG PO TABS: 5.0000 mg | ORAL_TABLET | Freq: Every day | ORAL | 1 refills | Status: DC

## 2021-01-08 NOTE — Telephone Encounter (Signed)
error 

## 2021-01-11 DIAGNOSIS — J449 Chronic obstructive pulmonary disease, unspecified: Secondary | ICD-10-CM | POA: Diagnosis not present

## 2021-01-20 DIAGNOSIS — J449 Chronic obstructive pulmonary disease, unspecified: Secondary | ICD-10-CM | POA: Diagnosis not present

## 2021-01-25 ENCOUNTER — Other Ambulatory Visit: Payer: Self-pay | Admitting: Cardiology

## 2021-01-25 DIAGNOSIS — I25118 Atherosclerotic heart disease of native coronary artery with other forms of angina pectoris: Secondary | ICD-10-CM

## 2021-01-25 DIAGNOSIS — I10 Essential (primary) hypertension: Secondary | ICD-10-CM

## 2021-02-06 ENCOUNTER — Telehealth (HOSPITAL_COMMUNITY): Payer: Self-pay

## 2021-02-06 ENCOUNTER — Encounter (HOSPITAL_COMMUNITY): Payer: Self-pay

## 2021-02-06 NOTE — Telephone Encounter (Signed)
Attempted to call patient in regards to Cardiac Rehab - LM on VM Mailed letter

## 2021-02-11 DIAGNOSIS — J449 Chronic obstructive pulmonary disease, unspecified: Secondary | ICD-10-CM | POA: Diagnosis not present

## 2021-02-13 ENCOUNTER — Other Ambulatory Visit: Payer: Self-pay

## 2021-02-13 ENCOUNTER — Encounter: Payer: Self-pay | Admitting: Cardiology

## 2021-02-13 ENCOUNTER — Ambulatory Visit: Payer: Medicare HMO | Admitting: Cardiology

## 2021-02-13 VITALS — BP 123/57 | HR 66 | Temp 98.7°F | Resp 16 | Ht 60.0 in | Wt 110.0 lb

## 2021-02-13 DIAGNOSIS — I10 Essential (primary) hypertension: Secondary | ICD-10-CM

## 2021-02-13 DIAGNOSIS — I25118 Atherosclerotic heart disease of native coronary artery with other forms of angina pectoris: Secondary | ICD-10-CM

## 2021-02-13 DIAGNOSIS — R0609 Other forms of dyspnea: Secondary | ICD-10-CM

## 2021-02-13 NOTE — Progress Notes (Signed)
Patient referred by [name], MD for coronary artery disease  Subjective:   Melissa Barron, female    DOB: 1931/07/01, 87 y.o.   MRN: [id number]   Chief Complaint  Patient presents with   Hypertension   Nonrheumatic aortic valve stenosis   Coronary Artery Disease   Follow-up    4 week    87 y.o. Caucasian female with hypertension, hyperlipidemia, CAD, COPD, depression, GERD, exertional dyspnea with hypoxia.   Patient underwent complex PCI to mid LAD ISR (11/2020), given her presentation with unstable angina. She has not had any recurrent chest pain recently. However, she continues to have random episdoes of acute shortness of breath. This does not occur consistently with exertion. She does have baseline COPD, with CXR pn 12/26/2020 showing chronic interstitial markings. Echocardiogram showed mild LVOT obstruction with mod LVH, and mild aortic stenosis. Her symptoms improved after changing metoprolol to bisoprolol., but soon returned.   Current Outpatient Medications on File Prior to Visit  Medication Sig Dispense Refill   amLODipine (NORVASC) 10 MG tablet Take 1 tablet (10 mg total) by mouth daily. 90 tablet 3   aspirin EC 81 MG tablet Take 1 tablet (81 mg total) by mouth daily. Swallow whole. 90 tablet 3   atorvastatin (LIPITOR) 80 MG tablet Take 1 tablet by mouth daily.     bisoprolol (ZEBETA) 5 MG tablet TAKE 1 TABLET (5 MG TOTAL) BY MOUTH DAILY. 90 tablet 1   Cholecalciferol (VITAMIN D3) 250 MCG (10000 UT) capsule Take 1 capsule (10,000 Units total) by mouth daily.     clopidogrel (PLAVIX) 75 MG tablet Take 1 tablet (75 mg total) by mouth daily. 30 tablet 1   denosumab (PROLIA) 60 MG/ML SOSY injection Inject 60 mg into the skin every 6 (six) months.     hydroxyurea (HYDREA) 500 MG capsule Take 1 capsule (500 mg total) by mouth daily. Mon through Friday. Off on weekends 90 capsule 3   irbesartan (AVAPRO) 300 MG tablet Take 1 tablet (300 mg total) by mouth at bedtime. 90 tablet 3    isosorbide mononitrate (IMDUR) 30 MG 24 hr tablet Take 1 tablet (30 mg total) by mouth daily. 90 tablet 3   nitroGLYCERIN (NITROSTAT) 0.4 MG SL tablet Place 1 tablet (0.4 mg total) under the tongue every 5 (five) minutes x 3 doses as needed for chest pain. 25 tablet 3   pantoprazole (PROTONIX) 40 MG tablet Take 1 tablet (40 mg total) by mouth daily. 90 tablet 3   sertraline (ZOLOFT) 100 MG tablet Take 1 tablet by mouth daily.     Tiotropium Bromide Monohydrate (SPIRIVA RESPIMAT) 2.5 MCG/ACT AERS Inhale 2 puffs into the lungs daily.     No current facility-administered medications on file prior to visit.    Cardiovascular and other pertinent studies:  EKG 01/03/2021: Sinus rhythm 68 bpm Anterior infarct, age indeterminate Inferior T wave inversion, consider iscehmia   Echocardiogram 01/03/2021: Left ventricle cavity is normal in size. Moderate concentric hypertrophy of the left ventricle. Normal global wall motion. Normal LV systolic function with EF 63%. Doppler evidence of grade I (impaired) diastolic dysfunction, normal LAP.  Left atrial cavity is moderately dilated. Trileaflet aortic valve with moderate calcification. Transvalvular mean PG 28 mmHg, max PG 44 mmHg, likely originating predominantly from LVOT than true valvular stenosis.  Moderate (grade II) aortic regurgitation. Mild tricuspid regurgitation. Estimated pulmonary artery systolic pressure 37 mmHg.  No significant change compared to previous study on 06/26/2020.   Coronary intervention 12/28/2020: LM:  Distal 20% stenosis LAD: Tortuous vessel        Prox-mid prior stent with focal 80% ISR        Attempted orbital atherectomy        Shockwave lithotripsy        PTCA and stent placement 3.5 X 12 mm Synergy drug-eluting stent        Intravascular ultrasound (IVUS) MSA 7.1 mm2        80%-->0% residual stenosis        TIMI flow II-->III LCx: Tortuous vessel        Mid focal 40% stenosis RCA: Prox-mid prior stent         Mid  40% ISR         RV marginal branch with prox 90% diffuse disease   LV-Ao Peak-to-peak gradient 21 mmHg LV-Ao Mean PG 20 mmHg Moderate aortic stenosis   Carotid artery duplex 06/26/2020:  Doppler velocity suggests stenosis in the right internal carotid artery  (16-49%). Stenosis in the right external carotid artery (<50%).  Doppler velocity suggests stenosis in the left internal carotid artery  (50-69%). Stenosis in the left external carotid artery (<50%).  Antegrade right vertebral artery flow. Antegrade left vertebral artery  flow.  Follow up in six months is appropriate if clinically indicated.  Recent labs: 05/12/2020: Glucose 80, BUN/Cr 17/0.7. EGFR 79. Na/K 137/4.1. Rest of the CMP normal H/H 14/44. MCV 106. Platelets 280 HbA1C N/A Chol 124, TG 106, HDL 42, LDL 61 TSH N/A    Review of Systems  Cardiovascular:  Positive for dyspnea on exertion. Negative for chest pain, leg swelling, palpitations and syncope.  Respiratory:  Positive for shortness of breath.         Vitals:   02/13/21 1316  BP: (!) 123/57  Pulse: 66  Resp: 16  Temp: 98.7 F (37.1 C)  SpO2: 91%    Body mass index is 21.48 kg/m. Filed Weights   02/13/21 1316  Weight: 110 lb (49.9 kg)     Objective:   Physical Exam Vitals and nursing note reviewed.  Constitutional:      General: She is not in acute distress. Neck:     Vascular: No JVD.  Cardiovascular:     Rate and Rhythm: Normal rate and regular rhythm.     Pulses:          Carotid pulses are  on the right side with bruit.    Heart sounds: Murmur heard.  Harsh midsystolic murmur is present with a grade of 3/6 at the upper right sternal border radiating to the neck.  Pulmonary:     Effort: Pulmonary effort is normal.     Breath sounds: Examination of the right-lower field reveals rales. Examination of the left-lower field reveals rales. Rales present. No wheezing.  Musculoskeletal:     Right lower leg: No edema.         Assessment & Recommendations:   87 y.o. Caucasian female with hypertension, hyperlipidemia, CAD, COPD, depression, GERD, exertional dyspnea with hypoxia.   CAD: Successful complex PCI for unstable angina (11/2020) Continue DAPT with Aspirin, plavix at least till 11/2021 Conitnue beta blocker, statin. I suspect her NTG use will now reduce.   Exertional dyspnea, hypoxia: Normal EF, no wall motional abnormality, only grade I diastolic dysfunction, mild LVOT obstruction with hyperdynamic LV with only mild aortic stenosis.  She does have rales on exam today. Rest of the exam is unremarkable for florid heart failure.  I will check chest Xray and  BNP. If both suggests heart failure, will start lasix. If both are normal, I suspect rales may be unrelated to cardiac etiology. Continue bisprolol  Hypertension: Controlled.   F/u in 4 weeks   Nigel Mormon, MD Pager: [phone] Office: [phone]

## 2021-02-14 ENCOUNTER — Ambulatory Visit
Admission: RE | Admit: 2021-02-14 | Discharge: 2021-02-14 | Disposition: A | Discharge status: Home / Self Care | Payer: Medicare HMO | Source: Ambulatory Visit | Attending: Cardiology | Admitting: Cardiology

## 2021-02-14 ENCOUNTER — Other Ambulatory Visit: Payer: Self-pay | Admitting: Cardiology

## 2021-02-14 DIAGNOSIS — R0602 Shortness of breath: Secondary | ICD-10-CM

## 2021-02-14 DIAGNOSIS — R0600 Dyspnea, unspecified: Secondary | ICD-10-CM | POA: Diagnosis not present

## 2021-02-15 LAB — BRAIN NATRIURETIC PEPTIDE: BNP: 790 pg/mL — ABNORMAL HIGH (ref 0.0–100.0)

## 2021-02-15 NOTE — Progress Notes (Signed)
Pt aware and blood work done yesterday

## 2021-02-16 ENCOUNTER — Other Ambulatory Visit: Payer: Self-pay | Admitting: Cardiology

## 2021-02-16 DIAGNOSIS — I5032 Chronic diastolic (congestive) heart failure: Secondary | ICD-10-CM

## 2021-02-16 MED ORDER — FUROSEMIDE 40 MG PO TABS
40.0000 mg | ORAL_TABLET | Freq: Every day | ORAL | 3 refills | Status: DC
Start: 2021-02-16 — End: 2021-03-12

## 2021-02-16 MED ORDER — POTASSIUM CHLORIDE CRYS ER 20 MEQ PO TBCR: 20.0000 meq | EXTENDED_RELEASE_TABLET | Freq: Every day | ORAL | 3 refills | Status: DC

## 2021-02-16 NOTE — Progress Notes (Signed)
Discussed the results with the patient. Elevated BNP suggests there is a component of HFpEF to her symptoms of shortness of breath, in addition to underlying emphysematous changes seen on chest Xray.  Recommend adding lasix 40 mg PO daily and Kdur 20 mEq daily.  Nigel Mormon, MD

## 2021-02-19 ENCOUNTER — Telehealth: Payer: Self-pay | Admitting: Pulmonary Disease

## 2021-02-19 NOTE — Telephone Encounter (Signed)
Pt is wanting to make sure that Dr. Silas Flood has received the information that Dr. Virgina Jock sent over to him in regards to recent findings stated that Dr. Virgina Jock called her on Saturday and informed her to make an appt with Dr. Silas Flood. Scheduled pt for 02/21/2021. Pls regard; [phone]

## 2021-02-19 NOTE — Telephone Encounter (Signed)
Spoke with pt who is wanting to make sure Dr. Silas Flood can see cardio notes, x-rays and labs. RN verified notes and results could be seen in Epic which they can be and notified pt of this. Pt stated understanding. Nothing further needed at this point.

## 2021-02-19 NOTE — Telephone Encounter (Signed)
Pt returning a phone call.pt can be reached at [phone]

## 2021-02-19 NOTE — Telephone Encounter (Signed)
Called patient but she did not answer. Left message for patient to call back.

## 2021-02-20 DIAGNOSIS — J449 Chronic obstructive pulmonary disease, unspecified: Secondary | ICD-10-CM | POA: Diagnosis not present

## 2021-02-21 ENCOUNTER — Other Ambulatory Visit: Payer: Self-pay

## 2021-02-21 ENCOUNTER — Ambulatory Visit: Payer: Medicare HMO | Admitting: Pulmonary Disease

## 2021-02-21 ENCOUNTER — Encounter: Payer: Self-pay | Admitting: Pulmonary Disease

## 2021-02-21 VITALS — BP 112/68 | HR 63 | Ht 60.0 in | Wt 110.1 lb

## 2021-02-21 DIAGNOSIS — R0609 Other forms of dyspnea: Secondary | ICD-10-CM

## 2021-02-21 DIAGNOSIS — J9611 Chronic respiratory failure with hypoxia: Secondary | ICD-10-CM

## 2021-02-21 DIAGNOSIS — J438 Other emphysema: Secondary | ICD-10-CM | POA: Diagnosis not present

## 2021-02-21 DIAGNOSIS — R0600 Dyspnea, unspecified: Secondary | ICD-10-CM

## 2021-02-21 MED ORDER — STIOLTO RESPIMAT 2.5-2.5 MCG/ACT IN AERS: 2.0000 | INHALATION_SPRAY | Freq: Every day | RESPIRATORY_TRACT | 3 refills | Status: DC

## 2021-02-21 NOTE — Telephone Encounter (Signed)
No response from pt.  Closed referral  

## 2021-02-21 NOTE — Progress Notes (Signed)
_0  ID: Melissa Barron, female    DOB: Mar 14, 1931, 87 y.o.   MRN: [id number]  Chief Complaint  Patient presents with   Follow-up    3 mo f/u. States she has been struggling with SOB, dizziness and lightheadedness since last visit. She has O2 at home and has been using 2L.     Referring provider: [name], MD  HPI:   87 year old whom we are seeing in follow up for evaluation of chronic hypoxemic respiratory failure.  Recent ntoes H+P, D/C summary from admission 11/2020 reviewed. Most recent cardiology note reviewed  Symptoms worsening. Reports lightheadedness, brain fog, dyspnea. Worse with exertion. Not using O2 outside house despite counseling importance last visit. 80% after ambulation to  exam room with similar symptoms. Improved with placing on oxygen. Had PCI to LAD 11/2020. Recent TTE reviewed with normal EF, G1 ddfxn, dilated LA, moderate AI. CXR 4/22 reviewed with small pleural effusions, chronic changes related to emphysema and cigarette smoking on my interpretation. CXR 01/2021 reviewed with resolution or improvement in pleural effusions with similar chronic changes  HPI at initial visit: Patient recently moved from [place] to the [place] area to be closer to her son.  He is retired.  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  Overall, she has chronic dyspnea.  Has been relatively stable for some years but now worse over the last 2 or 3 months.  This in the setting of not using her Spiriva.  Sound like he got the donut hole and got expensive.  She stopped using it for some time.  She recently got a new prescription of this the last week or 2 to start using again.  Her dyspnea gets better with rest.  Worse on inclines or stairs.  No clear changes in symptoms with seasons.  No change in symptoms with environment as she moved to the area and had been in her current living situation for 2 or 3 months prior to onset of worsening symptoms.  No time during day  were things are better or worse.  In the past [facility] has been very helpful.  No other alleviating or exacerbating factors.  Reviewed prior images which seem to indicate emphysema on CT scan.  There is some discussion of mild fibrosis that the patient attributes to prior pneumonias.  Not much discussion in prior pulmonary notes.  Reviewed serial PFTs which showed normal spirometry with markedly reduced or severely reduced DLCO in the 40-50% range on serial readings out of portion to spirometry findings.   PMH: Chronic hypoxemic respiratory failure, aortic regurg, aortic stenosis, mitral regurg, polycythemia vera, GERD, hypertension Surgical history: Hysterectomy Family history: Mother with CAD, sister with emphysema Social history: Former smoker, 40-50-pack-year smoking history, lives with husband   Licensed conveyancer / Pulmonary Flowsheets:   ACT:  No flowsheet data found.  MMRC: mMRC Dyspnea Scale mMRC Score  12/04/2020 2    Epworth:  No flowsheet data found.  Tests:   FENO:  No results found for: NITRICOXIDE  PFT: Personally reviewed results from 2019 through 09/2019 all with similar results that demonstrate on my interpretation normal spirometry without fixed obstruction, severely reduced DLCO.  WALK:  SIX MIN WALK 02/21/2021 12/04/2020  Supplimental Oxygen during Test? (L/min) Yes Yes  O2 Flow Rate 2 2  Tech Comments: Patient came into the office with her O2 at 80%. She was placed on 2L and her O2 only recovered to 85%. She was increased to 3L, O2 recovered to 97%. -  Imaging: Personally reviewed results of this discussion above  Lab Results: Personally reviewed and as per EMR via telehealth and care everywhere CBC    Component Value Date/Time   WBC 7.3 01/03/2021 1037   WBC 6.2 12/29/2020 0400   RBC 4.08 01/03/2021 1037   HGB 13.7 01/03/2021 1037   HCT 42.4 01/03/2021 1037   PLT 254 01/03/2021 1037   MCV 103.9 (H) 01/03/2021 1037   MCH 33.6 01/03/2021 1037   MCHC 32.3  01/03/2021 1037   RDW 14.2 01/03/2021 1037   LYMPHSABS 0.8 01/03/2021 1037   MONOABS 0.5 01/03/2021 1037   EOSABS 0.2 01/03/2021 1037   BASOSABS 0.1 01/03/2021 1037    BMET    Component Value Date/Time   NA 137 12/29/2020 0400   K 4.0 12/29/2020 0400   CL 105 12/29/2020 0400   CO2 24 12/29/2020 0400   GLUCOSE 95 12/29/2020 0400   BUN 11 12/29/2020 0400   CREATININE 0.74 12/29/2020 0400   CALCIUM 8.1 (L) 12/29/2020 0400   GFRNONAA >60 12/29/2020 0400    BNP    Component Value Date/Time   BNP 790.0 (H) 02/14/2021 1208   BNP 524.7 (H) 12/27/2020 1334    ProBNP No results found for: PROBNP  Specialty Problems       Pulmonary Problems   Exertional dyspnea    Allergies  Allergen Reactions   Latex Itching and Rash    Other reaction(s): Itching,Rash Red      Immunization History  Administered Date(s) Administered   Influenza, Quadrivalent, Recombinant, Inj, Pf 05/11/2020   PFIZER(Purple Top)SARS-COV-2 Vaccination 09/18/2019, 10/09/2019, 07/27/2020   Pneumococcal Conjugate-13 06/01/2015    Past Medical History:  Diagnosis Date   Coronary artery disease    Emphysema, unspecified ([place])    Both   Heart attack ([place]) 2016   Hyperlipidemia    Hypertension     Tobacco History: Social History   Tobacco Use  Smoking Status Former   Packs/day: 1.50   Years: 30.00   Pack years: 45.00   Types: Cigarettes   Quit date: 1980   Years since quitting: 42.5  Smokeless Tobacco Never   Counseling given: Not Answered   Continue to not smoke  Outpatient Encounter Medications as of 02/21/2021  Medication Sig   amLODipine (NORVASC) 10 MG tablet Take 1 tablet (10 mg total) by mouth daily.   aspirin EC 81 MG tablet Take 1 tablet (81 mg total) by mouth daily. Swallow whole.   atorvastatin (LIPITOR) 80 MG tablet Take 1 tablet by mouth daily.   bisoprolol (ZEBETA) 5 MG tablet TAKE 1 TABLET (5 MG TOTAL) BY MOUTH DAILY.   Cholecalciferol (VITAMIN D3) 250 MCG (10000 UT)  capsule Take 1 capsule (10,000 Units total) by mouth daily.   clopidogrel (PLAVIX) 75 MG tablet Take 1 tablet (75 mg total) by mouth daily.   denosumab (PROLIA) 60 MG/ML SOSY injection Inject 60 mg into the skin every 6 (six) months.   furosemide (LASIX) 40 MG tablet Take 1 tablet (40 mg total) by mouth daily.   hydroxyurea (HYDREA) 500 MG capsule Take 1 capsule (500 mg total) by mouth daily. Mon through Friday. Off on weekends   irbesartan (AVAPRO) 300 MG tablet Take 1 tablet (300 mg total) by mouth at bedtime.   isosorbide mononitrate (IMDUR) 30 MG 24 hr tablet Take 1 tablet (30 mg total) by mouth daily.   nitroGLYCERIN (NITROSTAT) 0.4 MG SL tablet Place 1 tablet (0.4 mg total) under the tongue every 5 (five) minutes x  3 doses as needed for chest pain.   pantoprazole (PROTONIX) 40 MG tablet Take 1 tablet (40 mg total) by mouth daily.   potassium chloride SA (KLOR-CON) 20 MEQ tablet Take 1 tablet (20 mEq total) by mouth daily.   sertraline (ZOLOFT) 100 MG tablet Take 1 tablet by mouth daily.   Tiotropium Bromide-Olodaterol (STIOLTO RESPIMAT) 2.5-2.5 MCG/ACT AERS Inhale 2 puffs into the lungs daily.   [DISCONTINUED] Tiotropium Bromide Monohydrate (SPIRIVA RESPIMAT) 2.5 MCG/ACT AERS Inhale 2 puffs into the lungs daily.   No facility-administered encounter medications on file as of 02/21/2021.     Review of Systems  Review of Systems  She denies chest pain with exertion, no orthopnea or PND.  Comprehensive review of systems otherwise negative Physical Exam  BP 112/68   Pulse 63   Ht 5' (1.524 m)   Wt 110 lb 1.6 oz (49.9 kg)   SpO2 97% Comment: on 3L  BMI 21.50 kg/m   Wt Readings from Last 5 Encounters:  02/21/21 110 lb 1.6 oz (49.9 kg)  02/13/21 110 lb (49.9 kg)  01/03/21 114 lb (51.7 kg)  01/03/21 113 lb 14.4 oz (51.7 kg)  12/29/20 111 lb 6.4 oz (50.5 kg)    BMI Readings from Last 5 Encounters:  02/21/21 21.50 kg/m  02/13/21 21.48 kg/m  01/03/21 22.26 kg/m  01/03/21 22.24  kg/m  12/29/20 21.76 kg/m     Physical Exam General: Elderly, frail, no acute distress Eyes: EOMI, no icterus Neck: Supple, no JVP Cardiovascular: Regular rhythm, no murmur Pulmonary: Clear to auscultation bilaterally, no wheeze or crackle Abdomen: Nondistended, bowel sounds present MSK: No synovitis, no joint effusion   Assessment & Plan:   Chronic Hypoxemic Respiratory Failure: Likely multifactorial related to emphysema, pulmonary vascular disease, report of scar from prior pneumonia on CT scan, likely component of mild cardiogenic pulmonary edema given dilated LA, aortic stenosis, aortic regurg, mitral valve regurg, diastolic dysfunction. DLCO is out of proportion to prior spirometry which is normal which indicates an extrapulmonary contribution. Volume overload noted CXR 11/2020, recent start lasix - suspect some subacute worsening due to volume. Counseled again to use O2 via [place] at all times - not wearing when outside of house which is likely largest contributor to symptoms.   Emphysema: Due to prior cigarette smoking.  Contributed to dyspnea exertion, hypoxemia.  Escalate to stiolto today from Spiriva.  DOE: Due to above. Highly encouraged enrollment in cardiac or pulmonary rehab.   Return in about 3 months (around 05/24/2021).   Lanier Clam, MD 02/21/2021  I spent 42 minutes in care of patient including face to face cisit, review of records, coordination of care.

## 2021-02-21 NOTE — Patient Instructions (Signed)
Nice to see you  I think the breathing is bad because of the lungs and the heart  Stop Spiriva once the new inhaler, Stiolto, arrives. Use Stiolto as prescribed. This is designed to help you get more air, I hope it helps.  You must wear the oxygen at all times, even when out of the house. Your oxygen drops when you move around and contributes to feeling light headed, foggy, fatigued, short of breath.   You can try stopping the pantoprazole for heartburn. If heartburn or reflux symptoms come back, it would be a good idea to resume this medication.  I think cardiac rehab would be great and help you fell better.   Return to clinic in 3 months or sooner as needed.

## 2021-02-25 ENCOUNTER — Telehealth (HOSPITAL_COMMUNITY): Payer: Self-pay

## 2021-02-25 NOTE — Telephone Encounter (Signed)
Pt is not interested in the cardiac rehab program do to the $10 copay. I advised pt if anything changed to call us back. Closed referral.

## 2021-03-12 ENCOUNTER — Other Ambulatory Visit: Payer: Self-pay

## 2021-03-12 DIAGNOSIS — I110 Hypertensive heart disease with heart failure: Secondary | ICD-10-CM | POA: Diagnosis not present

## 2021-03-12 DIAGNOSIS — J449 Chronic obstructive pulmonary disease, unspecified: Secondary | ICD-10-CM | POA: Diagnosis not present

## 2021-03-12 DIAGNOSIS — Z23 Encounter for immunization: Secondary | ICD-10-CM | POA: Diagnosis not present

## 2021-03-12 DIAGNOSIS — I5032 Chronic diastolic (congestive) heart failure: Secondary | ICD-10-CM

## 2021-03-12 DIAGNOSIS — J9611 Chronic respiratory failure with hypoxia: Secondary | ICD-10-CM | POA: Diagnosis not present

## 2021-03-12 DIAGNOSIS — M810 Age-related osteoporosis without current pathological fracture: Secondary | ICD-10-CM | POA: Diagnosis not present

## 2021-03-12 DIAGNOSIS — I2510 Atherosclerotic heart disease of native coronary artery without angina pectoris: Secondary | ICD-10-CM | POA: Diagnosis not present

## 2021-03-12 DIAGNOSIS — I10 Essential (primary) hypertension: Secondary | ICD-10-CM

## 2021-03-12 DIAGNOSIS — J849 Interstitial pulmonary disease, unspecified: Secondary | ICD-10-CM | POA: Diagnosis not present

## 2021-03-12 DIAGNOSIS — I25118 Atherosclerotic heart disease of native coronary artery with other forms of angina pectoris: Secondary | ICD-10-CM

## 2021-03-12 DIAGNOSIS — F418 Other specified anxiety disorders: Secondary | ICD-10-CM | POA: Diagnosis not present

## 2021-03-12 MED ORDER — FUROSEMIDE 40 MG PO TABS: 40.0000 mg | ORAL_TABLET | Freq: Every day | ORAL | 3 refills | Status: DC

## 2021-03-12 MED ORDER — POTASSIUM CHLORIDE CRYS ER 20 MEQ PO TBCR: 20.0000 meq | EXTENDED_RELEASE_TABLET | Freq: Every day | ORAL | 3 refills | Status: DC

## 2021-03-12 MED ORDER — BISOPROLOL FUMARATE 5 MG PO TABS: 5.0000 mg | ORAL_TABLET | Freq: Every day | ORAL | 3 refills | Status: DC

## 2021-03-12 MED ORDER — AMLODIPINE BESYLATE 10 MG PO TABS: 10.0000 mg | ORAL_TABLET | Freq: Every day | ORAL | 3 refills | Status: DC

## 2021-03-13 ENCOUNTER — Ambulatory Visit: Payer: Medicare HMO | Admitting: Cardiology

## 2021-03-13 ENCOUNTER — Encounter: Payer: Self-pay | Admitting: Cardiology

## 2021-03-13 ENCOUNTER — Other Ambulatory Visit: Payer: Self-pay

## 2021-03-13 VITALS — BP 118/52 | HR 61 | Temp 97.6°F | Resp 17 | Ht 60.0 in | Wt 110.6 lb

## 2021-03-13 DIAGNOSIS — I2510 Atherosclerotic heart disease of native coronary artery without angina pectoris: Secondary | ICD-10-CM | POA: Diagnosis not present

## 2021-03-13 DIAGNOSIS — I5032 Chronic diastolic (congestive) heart failure: Secondary | ICD-10-CM

## 2021-03-13 DIAGNOSIS — J449 Chronic obstructive pulmonary disease, unspecified: Secondary | ICD-10-CM | POA: Diagnosis not present

## 2021-03-13 DIAGNOSIS — I10 Essential (primary) hypertension: Secondary | ICD-10-CM | POA: Diagnosis not present

## 2021-03-13 MED ORDER — FUROSEMIDE 20 MG PO TABS: 40.0000 mg | ORAL_TABLET | Freq: Every day | ORAL | 3 refills | Status: DC

## 2021-03-13 NOTE — Progress Notes (Signed)
Patient referred by [name], MD for coronary artery disease  Subjective:   [name], female    DOB: 1930-12-19, 87 y.o.   MRN: [id number]   Chief Complaint  Patient presents with   Follow-up    4 WEEKS   Essential hypertension    87 y.o. Caucasian female with hypertension, hyperlipidemia, CAD, HFpEF, COPD, depression, GERD  Given elevated BNP, I started the patient on lasix 40 mg daily. She has noticed improvement in her breathing. Her O2 say still reads in 80s on pulse oximeter at home. She denies chest pain. She complains of dry mouth and occasional lightheadedness.   Current Outpatient Medications on File Prior to Visit  Medication Sig Dispense Refill   amLODipine (NORVASC) 10 MG tablet Take 1 tablet (10 mg total) by mouth daily. 90 tablet 3   aspirin EC 81 MG tablet Take 1 tablet (81 mg total) by mouth daily. Swallow whole. 90 tablet 3   atorvastatin (LIPITOR) 80 MG tablet Take 1 tablet by mouth daily.     bisoprolol (ZEBETA) 5 MG tablet Take 1 tablet (5 mg total) by mouth daily. 90 tablet 3   Cholecalciferol (VITAMIN D3) 250 MCG (10000 UT) capsule Take 1 capsule (10,000 Units total) by mouth daily.     clopidogrel (PLAVIX) 75 MG tablet Take 1 tablet (75 mg total) by mouth daily. 30 tablet 1   denosumab (PROLIA) 60 MG/ML SOSY injection Inject 60 mg into the skin every 6 (six) months.     furosemide (LASIX) 40 MG tablet Take 1 tablet (40 mg total) by mouth daily. 90 tablet 3   hydroxyurea (HYDREA) 500 MG capsule Take 1 capsule (500 mg total) by mouth daily. Mon through Friday. Off on weekends 90 capsule 3   irbesartan (AVAPRO) 300 MG tablet Take 1 tablet (300 mg total) by mouth at bedtime. 90 tablet 3   isosorbide mononitrate (IMDUR) 30 MG 24 hr tablet Take 1 tablet (30 mg total) by mouth daily. 90 tablet 3   nitroGLYCERIN (NITROSTAT) 0.4 MG SL tablet Place 1 tablet (0.4 mg total) under the tongue every 5 (five) minutes x 3 doses as needed for chest pain. 25 tablet 3    pantoprazole (PROTONIX) 40 MG tablet Take 1 tablet (40 mg total) by mouth daily. 90 tablet 3   potassium chloride SA (KLOR-CON) 20 MEQ tablet Take 1 tablet (20 mEq total) by mouth daily. 90 tablet 3   sertraline (ZOLOFT) 100 MG tablet Take 1 tablet by mouth daily.     Tiotropium Bromide-Olodaterol (STIOLTO RESPIMAT) 2.5-2.5 MCG/ACT AERS Inhale 2 puffs into the lungs daily. 4 g 3   No current facility-administered medications on file prior to visit.    Cardiovascular and other pertinent studies:  EKG 01/03/2021: Sinus rhythm 68 bpm Anterior infarct, age indeterminate Inferior T wave inversion, consider iscehmia   Echocardiogram 01/03/2021: Left ventricle cavity is normal in size. Moderate concentric hypertrophy of the left ventricle. Normal global wall motion. Normal LV systolic function with EF 63%. Doppler evidence of grade I (impaired) diastolic dysfunction, normal LAP.  Left atrial cavity is moderately dilated. Trileaflet aortic valve with moderate calcification. Transvalvular mean PG 28 mmHg, max PG 44 mmHg, likely originating predominantly from LVOT than true valvular stenosis.  Moderate (grade II) aortic regurgitation. Mild tricuspid regurgitation. Estimated pulmonary artery systolic pressure 37 mmHg.  No significant change compared to previous study on 06/26/2020.   Coronary intervention 12/28/2020: LM: Distal 20% stenosis LAD: Tortuous vessel  Prox-mid prior stent with focal 80% ISR        Attempted orbital atherectomy        Shockwave lithotripsy        PTCA and stent placement 3.5 X 12 mm Synergy drug-eluting stent        Intravascular ultrasound (IVUS) MSA 7.1 mm2        80%-->0% residual stenosis        TIMI flow II-->III LCx: Tortuous vessel        Mid focal 40% stenosis RCA: Prox-mid prior stent         Mid 40% ISR         RV marginal branch with prox 90% diffuse disease   LV-Ao Peak-to-peak gradient 21 mmHg LV-Ao Mean PG 20 mmHg Moderate aortic  stenosis   Carotid artery duplex 06/26/2020:  Doppler velocity suggests stenosis in the right internal carotid artery  (16-49%). Stenosis in the right external carotid artery (<50%).  Doppler velocity suggests stenosis in the left internal carotid artery  (50-69%). Stenosis in the left external carotid artery (<50%).  Antegrade right vertebral artery flow. Antegrade left vertebral artery  flow.  Follow up in six months is appropriate if clinically indicated.  Recent labs: 05/12/2020: Glucose 80, BUN/Cr 17/0.7. EGFR 79. Na/K 137/4.1. Rest of the CMP normal H/H 14/44. MCV 106. Platelets 280 HbA1C N/A Chol 124, TG 106, HDL 42, LDL 61 TSH N/A    Review of Systems  Cardiovascular:  Positive for dyspnea on exertion (improved). Negative for chest pain, leg swelling, palpitations and syncope.  Respiratory:  Positive for shortness of breath (improved).         Vitals:   03/13/21 1442  Resp: 17    Body mass index is 21.5 kg/m. There were no vitals filed for this visit.    Objective:   Physical Exam Vitals and nursing note reviewed.  Constitutional:      General: She is not in acute distress. Neck:     Vascular: No JVD.  Cardiovascular:     Rate and Rhythm: Normal rate and regular rhythm.     Pulses:          Carotid pulses are  on the right side with bruit.    Heart sounds: Murmur heard.  Harsh midsystolic murmur is present with a grade of 3/6 at the upper right sternal border radiating to the neck.  Pulmonary:     Effort: Pulmonary effort is normal.     Breath sounds: Examination of the right-lower field reveals rales. Examination of the left-lower field reveals rales. Rales present. No wheezing.  Musculoskeletal:     Right lower leg: No edema.     Left lower leg: No edema.        Assessment & Recommendations:   87 y.o. Caucasian female with hypertension, hyperlipidemia, CAD, HFpEF, COPD, depression, GERD  HFpEF: Contributing to her exertional dyspnea, along  with COPD.  Continue lasix, but reduce from 40 mg to 20 mg daily, given dry mouth and lightheadedness. I checked her pulse ox on fingers and forehead. Pulse px on forehead reads much higher. I suspect she may have poor recording from fingertips. Given her COPD, sats around 90-92% are acceptable.  CAD: Successful complex PCI for unstable angina (11/2020) Continue DAPT with Aspirin, plavix at least till 11/2021 Conitnue beta blocker, statin. I suspect her NTG use will now reduce.   Hypertension: Controlled.   F/u in 3 months   Nigel Mormon, MD Pager: [phone] Office:  [phone] 

## 2021-03-19 ENCOUNTER — Other Ambulatory Visit: Payer: Self-pay

## 2021-03-19 MED ORDER — FUROSEMIDE 20 MG PO TABS: 20.0000 mg | ORAL_TABLET | Freq: Every day | ORAL | 1 refills | Status: DC

## 2021-03-22 DIAGNOSIS — J449 Chronic obstructive pulmonary disease, unspecified: Secondary | ICD-10-CM | POA: Diagnosis not present

## 2021-04-08 ENCOUNTER — Encounter (HOSPITAL_COMMUNITY): Payer: Medicare HMO

## 2021-04-10 ENCOUNTER — Telehealth (HOSPITAL_COMMUNITY): Payer: Self-pay

## 2021-04-10 NOTE — Telephone Encounter (Signed)
Pt called [facility] and stated she is now interested in [facility] and will be able to pay her $10.00 co-pay. Adv pt where we are with scheduling and we will be in contact with her at a later date to get her schedule.

## 2021-04-10 NOTE — Telephone Encounter (Signed)
Pt insurance is active and benefits verified through [facility]. Co-pay $10.00, DED $0.00/$0.00 met, out of pocket $3,900.00/$1,140.60 met, co-insurance 0%. No pre-authorization required. Passport, 04/10/21 @ 3:31PM, [id number]

## 2021-04-11 ENCOUNTER — Telehealth (HOSPITAL_COMMUNITY): Payer: Self-pay

## 2021-04-11 NOTE — Telephone Encounter (Signed)
Called patient to see if she was interested in participating in the Cardiac Rehab Program. Patient stated yes. Patient will come in for orientation on 05/21/2021_0 :15pm and will attend the 11:15am exercise class.   Tourist information centre manager.

## 2021-04-22 DIAGNOSIS — J449 Chronic obstructive pulmonary disease, unspecified: Secondary | ICD-10-CM | POA: Diagnosis not present

## 2021-04-29 DIAGNOSIS — K130 Diseases of lips: Secondary | ICD-10-CM | POA: Diagnosis not present

## 2021-04-29 DIAGNOSIS — L821 Other seborrheic keratosis: Secondary | ICD-10-CM | POA: Diagnosis not present

## 2021-04-29 DIAGNOSIS — D692 Other nonthrombocytopenic purpura: Secondary | ICD-10-CM | POA: Diagnosis not present

## 2021-04-29 DIAGNOSIS — L820 Inflamed seborrheic keratosis: Secondary | ICD-10-CM | POA: Diagnosis not present

## 2021-04-29 DIAGNOSIS — L565 Disseminated superficial actinic porokeratosis (DSAP): Secondary | ICD-10-CM | POA: Diagnosis not present

## 2021-05-16 ENCOUNTER — Other Ambulatory Visit: Payer: Self-pay | Admitting: Cardiology

## 2021-05-16 ENCOUNTER — Telehealth (HOSPITAL_COMMUNITY): Payer: Self-pay

## 2021-05-16 DIAGNOSIS — I5032 Chronic diastolic (congestive) heart failure: Secondary | ICD-10-CM

## 2021-05-17 ENCOUNTER — Telehealth (HOSPITAL_COMMUNITY): Payer: Self-pay | Admitting: *Deleted

## 2021-05-17 NOTE — Telephone Encounter (Signed)
-----  Message from Nigel Mormon, MD sent at 05/16/2021  4:04 PM EDT ----- Regarding: RE: Oxygen use during exercise at cardiac rehab Yes. Okay by me.  Thanks MJP   ----- Message ----- From: Magda Kiel, RN Sent: 05/16/2021   2:25 PM EDT To: Nigel Mormon, MD Subject: Oxygen use during exercise at cardiac rehab    Good afternoon Dr Virgina Jock,  May we use oxygen at 2-3 liters of oxygen to keep her oxygen saturation greater than 94%? Anaisabel is coming to orientation on 05/21/21 and begin exercise on 05/27/21.   Thanks for your input!  Sincerely, [facility]  Cardiac Rehab

## 2021-05-21 ENCOUNTER — Encounter (HOSPITAL_COMMUNITY)
Admission: RE | Admit: 2021-05-21 | Discharge: 2021-05-21 | Disposition: A | Discharge status: Home / Self Care | Payer: Medicare HMO | Source: Ambulatory Visit | Attending: Cardiology | Admitting: Cardiology

## 2021-05-21 ENCOUNTER — Encounter (HOSPITAL_COMMUNITY): Payer: Self-pay

## 2021-05-21 ENCOUNTER — Telehealth: Payer: Self-pay

## 2021-05-21 ENCOUNTER — Other Ambulatory Visit: Payer: Self-pay

## 2021-05-21 VITALS — BP 108/42 | HR 57 | Ht 59.75 in | Wt 111.1 lb

## 2021-05-21 DIAGNOSIS — Z955 Presence of coronary angioplasty implant and graft: Secondary | ICD-10-CM | POA: Insufficient documentation

## 2021-05-21 NOTE — Telephone Encounter (Signed)
Called Melissa Barron and informed her to hold on rehab and pt will f/u with Dr. Virgina Jock.

## 2021-05-21 NOTE — Progress Notes (Signed)
Cardiac Individual Treatment Plan  Patient Details  Name: Melissa Barron MRN: [id number] Date of Birth: Nov 06, 1930 Referring Provider:   Flowsheet Row CARDIAC REHAB PHASE II ORIENTATION from 05/21/2021 in [facility]  Referring Provider Patwardhan, Reynold Bowen, MD       Initial Encounter Date:  [place] from 05/21/2021 in [facility]  Date 05/21/21       Visit Diagnosis: 12/28/20 DES LAD  Patient's Home Medications on Admission:  Current Outpatient Medications:    amLODipine (NORVASC) 10 MG tablet, Take 1 tablet (10 mg total) by mouth daily., Disp: 90 tablet, Rfl: 3   aspirin EC 81 MG tablet, Take 1 tablet (81 mg total) by mouth daily. Swallow whole., Disp: 90 tablet, Rfl: 3   atorvastatin (LIPITOR) 80 MG tablet, Take 80 mg by mouth daily., Disp: , Rfl:    bisoprolol (ZEBETA) 5 MG tablet, Take 1 tablet (5 mg total) by mouth daily., Disp: 90 tablet, Rfl: 3   Cholecalciferol (VITAMIN D3) 250 MCG (10000 UT) capsule, Take 1 capsule (10,000 Units total) by mouth daily., Disp: , Rfl:    clopidogrel (PLAVIX) 75 MG tablet, Take 1 tablet (75 mg total) by mouth daily., Disp: 30 tablet, Rfl: 1   furosemide (LASIX) 20 MG tablet, Take 1 tablet (20 mg total) by mouth daily., Disp: 90 tablet, Rfl: 1   hydroxyurea (HYDREA) 500 MG capsule, Take 1 capsule (500 mg total) by mouth daily. Mon through Friday. Off on weekends (Patient taking differently: Take 500 mg by mouth See admin instructions. Take 562m daily on Monday through Friday only. Off on weekends), Disp: 90 capsule, Rfl: 3   irbesartan (AVAPRO) 300 MG tablet, Take 1 tablet (300 mg total) by mouth at bedtime., Disp: 90 tablet, Rfl: 3   isosorbide mononitrate (IMDUR) 30 MG 24 hr tablet, Take 1 tablet (30 mg total) by mouth daily., Disp: 90 tablet, Rfl: 3   KLOR-CON M20 20 MEQ tablet, TAKE 1 TABLET BY MOUTH EVERY DAY, Disp: 90 tablet, Rfl: 1    nitroGLYCERIN (NITROSTAT) 0.4 MG SL tablet, Place 1 tablet (0.4 mg total) under the tongue every 5 (five) minutes x 3 doses as needed for chest pain., Disp: 25 tablet, Rfl: 3   pantoprazole (PROTONIX) 40 MG tablet, Take 1 tablet (40 mg total) by mouth daily., Disp: 90 tablet, Rfl: 3   sertraline (ZOLOFT) 100 MG tablet, Take 100 mg by mouth daily., Disp: , Rfl:    Tiotropium Bromide-Olodaterol (STIOLTO RESPIMAT) 2.5-2.5 MCG/ACT AERS, Inhale 2 puffs into the lungs daily., Disp: 4 g, Rfl: 3   denosumab (PROLIA) 60 MG/ML SOSY injection, Inject 60 mg into the skin every 6 (six) months. (Patient not taking: Reported on 05/21/2021), Disp: , Rfl:    furosemide (LASIX) 40 MG tablet, TAKE 1 TABLET BY MOUTH EVERY DAY (Patient not taking: Reported on 05/21/2021), Disp: 90 tablet, Rfl: 1  Past Medical History: Past Medical History:  Diagnosis Date   Coronary artery disease    Emphysema, unspecified ([facility]    Both   Heart attack ([facility] 2016   Hyperlipidemia    Hypertension     Tobacco Use: Social History   Tobacco Use  Smoking Status Former   Packs/day: 1.50   Years: 30.00   Pack years: 45.00   Types: Cigarettes   Quit date: 1980   Years since quitting: 42.7  Smokeless Tobacco Never    Labs: Recent Review [facility], physiological  Labs for ITP Cardiac and Pulmonary Rehab Latest Ref Rng & Units 12/27/2020   Cholestrol 0 - 200 mg/dL 122   LDLCALC 0 - 99 mg/dL 65   HDL >40 mg/dL 42   Trlycerides <150 mg/dL 74       Capillary Blood Glucose: No results found for: GLUCAP   Exercise Target Goals: Exercise Program Goal: Individual exercise prescription set using results from initial 6 min walk test and THRR while considering  patient's activity barriers and safety.   Exercise Prescription Goal: Starting with aerobic activity 30 plus minutes a day, 3 days per week for initial exercise prescription. Provide home exercise prescription and guidelines that participant acknowledges understanding prior  to discharge.  Activity Barriers & Risk Stratification:  Activity Barriers & Cardiac Risk Stratification - 05/21/21 1343       Activity Barriers & Cardiac Risk Stratification   Activity Barriers Shortness of Breath;Balance Concerns;History of Falls    Cardiac Risk Stratification High             6 Minute Walk:  6 Minute Walk     Row Name 05/21/21 1402         6 Minute Walk   Phase Initial     Distance 800 feet     Walk Time 6 minutes     # of Rest Breaks 0     MPH 1.51     METS 0.79     RPE 11     Perceived Dyspnea  2     VO2 Peak 2.77     Symptoms Yes (comment)     Comments Patient c/o chest tightness, and mild shortness of breath, 2/4 on dyspnea scale.     Resting HR 57 bpm     Resting BP 108/42     Resting Oxygen Saturation  95 %     Exercise Oxygen Saturation  during 6 min walk 86 %     Max Ex. HR 69 bpm     Max Ex. BP 118/52     2 Minute Post BP 122/48           Interval Oxygen   Interval Oxygen? Yes     Baseline Oxygen Saturation % 95 %     1 Minute Oxygen Saturation % 96 %     1 Minute Liters of Oxygen 3 L     2 Minute Oxygen Saturation % 93 %     2 Minute Liters of Oxygen 3 L     3 Minute Oxygen Saturation % 92 %     3 Minute Liters of Oxygen 3 L     4 Minute Oxygen Saturation % 89 %     4 Minute Liters of Oxygen 3 L     5 Minute Oxygen Saturation % 86 %     5 Minute Liters of Oxygen 3 L     6 Minute Oxygen Saturation % 86 %     6 Minute Liters of Oxygen 3 L     2 Minute Post Oxygen Saturation % 86 %     2 Minute Post Liters of Oxygen 3 L              Oxygen Initial Assessment:   Oxygen Re-Evaluation:   Oxygen Discharge (Final Oxygen Re-Evaluation):   Initial Exercise Prescription:  Initial Exercise Prescription - 05/21/21 1500       Date of Initial Exercise RX and Referring Provider   Date 05/21/21    Referring Provider  Nigel Mormon, MD    Expected Discharge Date 07/19/21      Oxygen   Oxygen Continuous    Liters  2-3    Maintain Oxygen Saturation 88% or higher      Recumbant Bike   Level 1    Minutes 15    METs 1.5      NuStep   Level 1    SPM 80    Minutes 15    METs 1.5      Prescription Details   Frequency (times per week) 2    Duration Progress to 30 minutes of continuous aerobic without signs/symptoms of physical distress      Intensity   THRR 40-80% of Max Heartrate 53-106    Ratings of Perceived Exertion 11-13    Perceived Dyspnea 0-4      Progression   Progression Continue to progress workloads to maintain intensity without signs/symptoms of physical distress.      Resistance Training   Training Prescription Yes    Weight 2 lbs    Reps 10-15             Perform Capillary Blood Glucose checks as needed.  Exercise Prescription Changes:   Exercise Comments:   Exercise Goals and Review:   Exercise Goals     Row Name 05/21/21 1343             Exercise Goals   Increase Physical Activity Yes       Intervention Develop an individualized exercise prescription for aerobic and resistive training based on initial evaluation findings, risk stratification, comorbidities and participant's personal goals.;Provide advice, education, support and counseling about physical activity/exercise needs.       Expected Outcomes Short Term: Attend rehab on a regular basis to increase amount of physical activity.;Long Term: Exercising regularly at least 3-5 days a week.;Long Term: Add in home exercise to make exercise part of routine and to increase amount of physical activity.       Increase Strength and Stamina Yes       Intervention Provide advice, education, support and counseling about physical activity/exercise needs.;Develop an individualized exercise prescription for aerobic and resistive training based on initial evaluation findings, risk stratification, comorbidities and participant's personal goals.       Expected Outcomes Short Term: Increase workloads from initial exercise  prescription for resistance, speed, and METs.;Short Term: Perform resistance training exercises routinely during rehab and add in resistance training at home;Long Term: Improve cardiorespiratory fitness, muscular endurance and strength as measured by increased METs and functional capacity (6MWT)       Able to understand and use rate of perceived exertion (RPE) scale Yes       Intervention Provide education and explanation on how to use RPE scale       Expected Outcomes Short Term: Able to use RPE daily in rehab to express subjective intensity level;Long Term:  Able to use RPE to guide intensity level when exercising independently       Able to understand and use Dyspnea scale Yes       Intervention Provide education and explanation on how to use Dyspnea scale       Expected Outcomes Short Term: Able to use Dyspnea scale daily in rehab to express subjective sense of shortness of breath during exertion;Long Term: Able to use Dyspnea scale to guide intensity level when exercising independently       Knowledge and understanding of Target Heart Rate Range (THRR) Yes  Intervention Provide education and explanation of THRR including how the numbers were predicted and where they are located for reference       Expected Outcomes Short Term: Able to state/look up THRR;Long Term: Able to use THRR to govern intensity when exercising independently;Short Term: Able to use daily as guideline for intensity in rehab       Able to check pulse independently Yes       Intervention Provide education and demonstration on how to check pulse in carotid and radial arteries.;Review the importance of being able to check your own pulse for safety during independent exercise       Expected Outcomes Short Term: Able to explain why pulse checking is important during independent exercise;Long Term: Able to check pulse independently and accurately       Understanding of Exercise Prescription Yes       Intervention Provide  education, explanation, and written materials on patient's individual exercise prescription       Expected Outcomes Short Term: Able to explain program exercise prescription;Long Term: Able to explain home exercise prescription to exercise independently                Exercise Goals Re-Evaluation :    Discharge Exercise Prescription (Final Exercise Prescription Changes):   Nutrition:  Target Goals: Understanding of nutrition guidelines, daily intake of sodium <157m, cholesterol <2067m calories 30% from fat and 7% or less from saturated fats, daily to have 5 or more servings of fruits and vegetables.  Biometrics:  Pre Biometrics - 05/21/21 1322       Pre Biometrics   Waist Circumference 30 inches    Hip Circumference 37 inches    Waist to Hip Ratio 0.81 %    Triceps Skinfold 18 mm    % Body Fat 33.9 %    Grip Strength 19 kg    Flexibility 11.75 in    Single Leg Stand 13.87 seconds              Nutrition Therapy Plan and Nutrition Goals:   Nutrition Assessments:  MEDIFICTS Score Key: ?70 Need to make dietary changes  40-70 Heart Healthy Diet ? 40 Therapeutic Level Cholesterol Diet   Picture Your Plate Scores: <4<16nhealthy dietary pattern with much room for improvement. 41-50 Dietary pattern unlikely to meet recommendations for good health and room for improvement. 51-60 More healthful dietary pattern, with some room for improvement.  >60 Healthy dietary pattern, although there may be some specific behaviors that could be improved.    Nutrition Goals Re-Evaluation:   Nutrition Goals Discharge (Final Nutrition Goals Re-Evaluation):   Psychosocial: Target Goals: Acknowledge presence or absence of significant depression and/or stress, maximize coping skills, provide positive support system. Participant is able to verbalize types and ability to use techniques and skills needed for reducing stress and depression.  Initial Review & Psychosocial  Screening:  Initial Psych Review & Screening - 05/21/21 1646       Initial Review   Current issues with History of Depression;Current Stress Concerns    Source of Stress Concerns Chronic Illness;Unable to participate in former interests or hobbies;Unable to perform yard/household activities    Comments AnAlethaays that she has been on an antidepressant for over 30 years says her depression is under control      [place] Station[place]   [place] her husband and children for support. 4 boys     Barriers   Psychosocial barriers to participate in  program The patient should benefit from training in stress management and relaxation.      Screening Interventions   Interventions Encouraged to exercise    Expected Outcomes Long Term goal: The participant improves quality of Life and PHQ9 Scores as seen by post scores and/or verbalization of changes;Long Term Goal: Stressors or current issues are controlled or eliminated.             Quality of Life Scores:  Scores of 19 and below usually indicate a poorer quality of life in these areas.  A difference of  2-3 points is a clinically meaningful difference.  A difference of 2-3 points in the total score of the Quality of Life Index has been associated with significant improvement in overall quality of life, self-image, physical symptoms, and general health in studies assessing change in quality of life.  PHQ-9: Recent Review Flowsheet Data     Depression screen [facility] 2/9 05/21/2021   Decreased Interest 0   Down, Depressed, Hopeless 0   PHQ - 2 Score 0      Interpretation of Total Score  Total Score Depression Severity:  1-4 = Minimal depression, 5-9 = Mild depression, 10-14 = Moderate depression, 15-19 = Moderately severe depression, 20-27 = Severe depression   Psychosocial Evaluation and Intervention:   Psychosocial Re-Evaluation:   Psychosocial Discharge (Final Psychosocial Re-Evaluation):   Vocational  Rehabilitation: Provide vocational rehab assistance to qualifying candidates.   Vocational Rehab Evaluation & Intervention:  Vocational Rehab - 05/21/21 1649       Initial Vocational Rehab Evaluation & Intervention   Assessment shows need for Vocational Rehabilitation No   Dreya is retired and does not need vocational rehab at this time.            Education: Education Goals: Education classes will be provided on a weekly basis, covering required topics. Participant will state understanding/return demonstration of topics presented.  Learning Barriers/Preferences:  Learning Barriers/Preferences - 05/21/21 1648       Learning Barriers/Preferences   Learning Barriers Inability to learn new things;Hearing;Sight   Wears reading glasses, very HOH has bilateral hearing aides. Says has some memory deficits   Learning Preferences Skilled Demonstration;Verbal Instruction;Individual Instruction;Audio             Education Topics: Hypertension, Hypertension Reduction -Define heart disease and high blood pressure. Discus how high blood pressure affects the body and ways to reduce high blood pressure.   Exercise and Your Heart -Discuss why it is important to exercise, the FITT principles of exercise, normal and abnormal responses to exercise, and how to exercise safely.   Angina -Discuss definition of angina, causes of angina, treatment of angina, and how to decrease risk of having angina.   Cardiac Medications -Review what the following cardiac medications are used for, how they affect the body, and side effects that may occur when taking the medications.  Medications include Aspirin, Beta blockers, calcium channel blockers, ACE Inhibitors, angiotensin receptor blockers, diuretics, digoxin, and antihyperlipidemics.   Congestive Heart Failure -Discuss the definition of CHF, how to live with CHF, the signs and symptoms of CHF, and how keep track of weight and sodium  intake.   Heart Disease and Intimacy -Discus the effect sexual activity has on the heart, how changes occur during intimacy as we age, and safety during sexual activity.   Smoking Cessation / COPD -Discuss different methods to quit smoking, the health benefits of quitting smoking, and the definition of COPD.   Nutrition I: Fats -Discuss the  types of cholesterol, what cholesterol does to the heart, and how cholesterol levels can be controlled.   Nutrition II: Labels -Discuss the different components of food labels and how to read food label   Heart Parts/Heart Disease and PAD -Discuss the anatomy of the heart, the pathway of blood circulation through the heart, and these are affected by heart disease.   Stress I: Signs and Symptoms -Discuss the causes of stress, how stress may lead to anxiety and depression, and ways to limit stress.   Stress II: Relaxation -Discuss different types of relaxation techniques to limit stress.   Warning Signs of Stroke / TIA -Discuss definition of a stroke, what the signs and symptoms are of a stroke, and how to identify when someone is having stroke.   Knowledge Questionnaire Score:  Knowledge Questionnaire Score - 05/21/21 1555       Knowledge Questionnaire Score   Pre Score 20/24             Core Components/Risk Factors/Patient Goals at Admission:  Personal Goals and Risk Factors at Admission - 05/21/21 1343       Core Components/Risk Factors/Patient Goals on Admission   Improve shortness of breath with ADL's Yes    Intervention Provide education, individualized exercise plan and daily activity instruction to help decrease symptoms of SOB with activities of daily living.    Expected Outcomes Short Term: Improve cardiorespiratory fitness to achieve a reduction of symptoms when performing ADLs;Long Term: Be able to perform more ADLs without symptoms or delay the onset of symptoms    Hypertension Yes    Intervention Provide  education on lifestyle modifcations including regular physical activity/exercise, weight management, moderate sodium restriction and increased consumption of fresh fruit, vegetables, and low fat dairy, alcohol moderation, and smoking cessation.;Monitor prescription use compliance.    Expected Outcomes Short Term: Continued assessment and intervention until BP is < 140/62m HG in hypertensive participants. < 130/828mHG in hypertensive participants with diabetes, heart failure or chronic kidney disease.;Long Term: Maintenance of blood pressure at goal levels.    Lipids Yes    Intervention Provide education and support for participant on nutrition & aerobic/resistive exercise along with prescribed medications to achieve LDL <7016mHDL >2m50m  Expected Outcomes Short Term: Participant states understanding of desired cholesterol values and is compliant with medications prescribed. Participant is following exercise prescription and nutrition guidelines.;Long Term: Cholesterol controlled with medications as prescribed, with individualized exercise RX and with personalized nutrition plan. Value goals: LDL < 70mg3mL > 40 mg.    Stress Yes    Intervention Offer individual and/or small group education and counseling on adjustment to heart disease, stress management and health-related lifestyle change. Teach and support self-help strategies.;Refer participants experiencing significant psychosocial distress to appropriate mental health specialists for further evaluation and treatment. When possible, include family members and significant others in education/counseling sessions.    Expected Outcomes Short Term: Participant demonstrates changes in health-related behavior, relaxation and other stress management skills, ability to obtain effective social support, and compliance with psychotropic medications if prescribed.;Long Term: Emotional wellbeing is indicated by absence of clinically significant psychosocial  distress or social isolation.             Core Components/Risk Factors/Patient Goals Review:    Core Components/Risk Factors/Patient Goals at Discharge (Final Review):    ITP Comments:  ITP Comments     Row Name 05/21/21 1322           ITP Comments Medical Director- Dr.  Fransico Him, MD                Comments: CommentsLelon Frohlich  attended orientation on 05/21/2021 to review rules and guidelines for program.  Completed 6 minute walk test, Intitial ITP, and exercise prescription.  VSS. Telemetry-Sinus Rhythm first degree heart block.Lelon Frohlich reported having 2/4 dyspnea and chest tightness 2/10 please see previous note for documentation. Safety measures and social distancing in place per CDC guidelines. Waneta's exercise is currently on hold until after seen by Dr Virgina Jock and cleared to begin exercise.Barnet Pall, RN,BSN 05/21/2021

## 2021-05-21 NOTE — Telephone Encounter (Signed)
O2 sats are low for this patient at baseline owing to her COPD. However, given her chest tightness, reasomable to hold until f/u w/me.  Please schedule f/u w/me in the coming 1-2 weeks.  Thanks MJP

## 2021-05-21 NOTE — Progress Notes (Deleted)
Cardiac Individual Treatment Plan  Patient Details  Name: Melissa Barron MRN: [id number] Date of Birth: Nov 06, 1930 Referring Provider:   Flowsheet Row CARDIAC REHAB PHASE II ORIENTATION from 05/21/2021 in [facility]  Referring Provider Patwardhan, Reynold Bowen, MD       Initial Encounter Date:  [place] from 05/21/2021 in [facility]  Date 05/21/21       Visit Diagnosis: 12/28/20 DES LAD  Patient's Home Medications on Admission:  Current Outpatient Medications:    amLODipine (NORVASC) 10 MG tablet, Take 1 tablet (10 mg total) by mouth daily., Disp: 90 tablet, Rfl: 3   aspirin EC 81 MG tablet, Take 1 tablet (81 mg total) by mouth daily. Swallow whole., Disp: 90 tablet, Rfl: 3   atorvastatin (LIPITOR) 80 MG tablet, Take 80 mg by mouth daily., Disp: , Rfl:    bisoprolol (ZEBETA) 5 MG tablet, Take 1 tablet (5 mg total) by mouth daily., Disp: 90 tablet, Rfl: 3   Cholecalciferol (VITAMIN D3) 250 MCG (10000 UT) capsule, Take 1 capsule (10,000 Units total) by mouth daily., Disp: , Rfl:    clopidogrel (PLAVIX) 75 MG tablet, Take 1 tablet (75 mg total) by mouth daily., Disp: 30 tablet, Rfl: 1   furosemide (LASIX) 20 MG tablet, Take 1 tablet (20 mg total) by mouth daily., Disp: 90 tablet, Rfl: 1   hydroxyurea (HYDREA) 500 MG capsule, Take 1 capsule (500 mg total) by mouth daily. Mon through Friday. Off on weekends (Patient taking differently: Take 500 mg by mouth See admin instructions. Take 562m daily on Monday through Friday only. Off on weekends), Disp: 90 capsule, Rfl: 3   irbesartan (AVAPRO) 300 MG tablet, Take 1 tablet (300 mg total) by mouth at bedtime., Disp: 90 tablet, Rfl: 3   isosorbide mononitrate (IMDUR) 30 MG 24 hr tablet, Take 1 tablet (30 mg total) by mouth daily., Disp: 90 tablet, Rfl: 3   KLOR-CON M20 20 MEQ tablet, TAKE 1 TABLET BY MOUTH EVERY DAY, Disp: 90 tablet, Rfl: 1    nitroGLYCERIN (NITROSTAT) 0.4 MG SL tablet, Place 1 tablet (0.4 mg total) under the tongue every 5 (five) minutes x 3 doses as needed for chest pain., Disp: 25 tablet, Rfl: 3   pantoprazole (PROTONIX) 40 MG tablet, Take 1 tablet (40 mg total) by mouth daily., Disp: 90 tablet, Rfl: 3   sertraline (ZOLOFT) 100 MG tablet, Take 100 mg by mouth daily., Disp: , Rfl:    Tiotropium Bromide-Olodaterol (STIOLTO RESPIMAT) 2.5-2.5 MCG/ACT AERS, Inhale 2 puffs into the lungs daily., Disp: 4 g, Rfl: 3   denosumab (PROLIA) 60 MG/ML SOSY injection, Inject 60 mg into the skin every 6 (six) months. (Patient not taking: Reported on 05/21/2021), Disp: , Rfl:    furosemide (LASIX) 40 MG tablet, TAKE 1 TABLET BY MOUTH EVERY DAY (Patient not taking: Reported on 05/21/2021), Disp: 90 tablet, Rfl: 1  Past Medical History: Past Medical History:  Diagnosis Date   Coronary artery disease    Emphysema, unspecified ([facility]    Both   Heart attack ([facility] 2016   Hyperlipidemia    Hypertension     Tobacco Use: Social History   Tobacco Use  Smoking Status Former   Packs/day: 1.50   Years: 30.00   Pack years: 45.00   Types: Cigarettes   Quit date: 1980   Years since quitting: 42.7  Smokeless Tobacco Never    Labs: Recent Review [facility], physiological  Labs for ITP Cardiac and Pulmonary Rehab Latest Ref Rng & Units 12/27/2020   Cholestrol 0 - 200 mg/dL 122   LDLCALC 0 - 99 mg/dL 65   HDL >40 mg/dL 42   Trlycerides <150 mg/dL 74       Capillary Blood Glucose: No results found for: GLUCAP   Exercise Target Goals: Exercise Program Goal: Individual exercise prescription set using results from initial 6 min walk test and THRR while considering  patient's activity barriers and safety.   Exercise Prescription Goal: Starting with aerobic activity 30 plus minutes a day, 3 days per week for initial exercise prescription. Provide home exercise prescription and guidelines that participant acknowledges understanding prior  to discharge.  Activity Barriers & Risk Stratification:  Activity Barriers & Cardiac Risk Stratification - 05/21/21 1343       Activity Barriers & Cardiac Risk Stratification   Activity Barriers Shortness of Breath;Balance Concerns;History of Falls    Cardiac Risk Stratification High             6 Minute Walk:  6 Minute Walk     Row Name 05/21/21 1402         6 Minute Walk   Phase Initial     Distance 800 feet     Walk Time 6 minutes     # of Rest Breaks 0     MPH 1.51     METS 0.79     RPE 11     Perceived Dyspnea  2     VO2 Peak 2.77     Symptoms Yes (comment)     Comments Patient c/o chest tightness, and mild shortness of breath, 2/4 on dyspnea scale.     Resting HR 57 bpm     Resting BP 108/42     Resting Oxygen Saturation  95 %     Exercise Oxygen Saturation  during 6 min walk 86 %     Max Ex. HR 69 bpm     Max Ex. BP 118/52     2 Minute Post BP 122/48           Interval Oxygen   Interval Oxygen? Yes     Baseline Oxygen Saturation % 95 %     1 Minute Oxygen Saturation % 96 %     1 Minute Liters of Oxygen 3 L     2 Minute Oxygen Saturation % 93 %     2 Minute Liters of Oxygen 3 L     3 Minute Oxygen Saturation % 92 %     3 Minute Liters of Oxygen 3 L     4 Minute Oxygen Saturation % 89 %     4 Minute Liters of Oxygen 3 L     5 Minute Oxygen Saturation % 86 %     5 Minute Liters of Oxygen 3 L     6 Minute Oxygen Saturation % 86 %     6 Minute Liters of Oxygen 3 L     2 Minute Post Oxygen Saturation % 86 %     2 Minute Post Liters of Oxygen 3 L              Oxygen Initial Assessment:   Oxygen Re-Evaluation:   Oxygen Discharge (Final Oxygen Re-Evaluation):   Initial Exercise Prescription:  Initial Exercise Prescription - 05/21/21 1500       Date of Initial Exercise RX and Referring Provider   Date 05/21/21    Referring Provider  Nigel Mormon, MD    Expected Discharge Date 07/19/21      Oxygen   Oxygen Continuous    Liters  2-3    Maintain Oxygen Saturation 88% or higher      Recumbant Bike   Level 1    Minutes 15    METs 1.5      NuStep   Level 1    SPM 80    Minutes 15    METs 1.5      Prescription Details   Frequency (times per week) 2    Duration Progress to 30 minutes of continuous aerobic without signs/symptoms of physical distress      Intensity   THRR 40-80% of Max Heartrate 53-106    Ratings of Perceived Exertion 11-13    Perceived Dyspnea 0-4      Progression   Progression Continue to progress workloads to maintain intensity without signs/symptoms of physical distress.      Resistance Training   Training Prescription Yes    Weight 2 lbs    Reps 10-15             Perform Capillary Blood Glucose checks as needed.  Exercise Prescription Changes:   Exercise Comments:   Exercise Goals and Review:   Exercise Goals     Row Name 05/21/21 1343             Exercise Goals   Increase Physical Activity Yes       Intervention Develop an individualized exercise prescription for aerobic and resistive training based on initial evaluation findings, risk stratification, comorbidities and participant's personal goals.;Provide advice, education, support and counseling about physical activity/exercise needs.       Expected Outcomes Short Term: Attend rehab on a regular basis to increase amount of physical activity.;Long Term: Exercising regularly at least 3-5 days a week.;Long Term: Add in home exercise to make exercise part of routine and to increase amount of physical activity.       Increase Strength and Stamina Yes       Intervention Provide advice, education, support and counseling about physical activity/exercise needs.;Develop an individualized exercise prescription for aerobic and resistive training based on initial evaluation findings, risk stratification, comorbidities and participant's personal goals.       Expected Outcomes Short Term: Increase workloads from initial exercise  prescription for resistance, speed, and METs.;Short Term: Perform resistance training exercises routinely during rehab and add in resistance training at home;Long Term: Improve cardiorespiratory fitness, muscular endurance and strength as measured by increased METs and functional capacity (6MWT)       Able to understand and use rate of perceived exertion (RPE) scale Yes       Intervention Provide education and explanation on how to use RPE scale       Expected Outcomes Short Term: Able to use RPE daily in rehab to express subjective intensity level;Long Term:  Able to use RPE to guide intensity level when exercising independently       Able to understand and use Dyspnea scale Yes       Intervention Provide education and explanation on how to use Dyspnea scale       Expected Outcomes Short Term: Able to use Dyspnea scale daily in rehab to express subjective sense of shortness of breath during exertion;Long Term: Able to use Dyspnea scale to guide intensity level when exercising independently       Knowledge and understanding of Target Heart Rate Range (THRR) Yes  Intervention Provide education and explanation of THRR including how the numbers were predicted and where they are located for reference       Expected Outcomes Short Term: Able to state/look up THRR;Long Term: Able to use THRR to govern intensity when exercising independently;Short Term: Able to use daily as guideline for intensity in rehab       Able to check pulse independently Yes       Intervention Provide education and demonstration on how to check pulse in carotid and radial arteries.;Review the importance of being able to check your own pulse for safety during independent exercise       Expected Outcomes Short Term: Able to explain why pulse checking is important during independent exercise;Long Term: Able to check pulse independently and accurately       Understanding of Exercise Prescription Yes       Intervention Provide  education, explanation, and written materials on patient's individual exercise prescription       Expected Outcomes Short Term: Able to explain program exercise prescription;Long Term: Able to explain home exercise prescription to exercise independently                Exercise Goals Re-Evaluation :    Discharge Exercise Prescription (Final Exercise Prescription Changes):   Nutrition:  Target Goals: Understanding of nutrition guidelines, daily intake of sodium <157m, cholesterol <2067m calories 30% from fat and 7% or less from saturated fats, daily to have 5 or more servings of fruits and vegetables.  Biometrics:  Pre Biometrics - 05/21/21 1322       Pre Biometrics   Waist Circumference 30 inches    Hip Circumference 37 inches    Waist to Hip Ratio 0.81 %    Triceps Skinfold 18 mm    % Body Fat 33.9 %    Grip Strength 19 kg    Flexibility 11.75 in    Single Leg Stand 13.87 seconds              Nutrition Therapy Plan and Nutrition Goals:   Nutrition Assessments:  MEDIFICTS Score Key: ?70 Need to make dietary changes  40-70 Heart Healthy Diet ? 40 Therapeutic Level Cholesterol Diet   Picture Your Plate Scores: <4<16nhealthy dietary pattern with much room for improvement. 41-50 Dietary pattern unlikely to meet recommendations for good health and room for improvement. 51-60 More healthful dietary pattern, with some room for improvement.  >60 Healthy dietary pattern, although there may be some specific behaviors that could be improved.    Nutrition Goals Re-Evaluation:   Nutrition Goals Discharge (Final Nutrition Goals Re-Evaluation):   Psychosocial: Target Goals: Acknowledge presence or absence of significant depression and/or stress, maximize coping skills, provide positive support system. Participant is able to verbalize types and ability to use techniques and skills needed for reducing stress and depression.  Initial Review & Psychosocial  Screening:  Initial Psych Review & Screening - 05/21/21 1646       Initial Review   Current issues with History of Depression;Current Stress Concerns    Source of Stress Concerns Chronic Illness;Unable to participate in former interests or hobbies;Unable to perform yard/household activities    Comments Melissa Barron that she has been on an antidepressant for over 30 years says her depression is under control      [place]   [place] her husband and children for support. 4 boys     Barriers   Psychosocial barriers to participate in  program The patient should benefit from training in stress management and relaxation.      Screening Interventions   Interventions Encouraged to exercise    Expected Outcomes Long Term goal: The participant improves quality of Life and PHQ9 Scores as seen by post scores and/or verbalization of changes;Long Term Goal: Stressors or current issues are controlled or eliminated.             Quality of Life Scores:  Scores of 19 and below usually indicate a poorer quality of life in these areas.  A difference of  2-3 points is a clinically meaningful difference.  A difference of 2-3 points in the total score of the Quality of Life Index has been associated with significant improvement in overall quality of life, self-image, physical symptoms, and general health in studies assessing change in quality of life.  PHQ-9: Recent Review Flowsheet Data     Depression screen [facility] 2/9 05/21/2021   Decreased Interest 0   Down, Depressed, Hopeless 0   PHQ - 2 Score 0      Interpretation of Total Score  Total Score Depression Severity:  1-4 = Minimal depression, 5-9 = Mild depression, 10-14 = Moderate depression, 15-19 = Moderately severe depression, 20-27 = Severe depression   Psychosocial Evaluation and Intervention:   Psychosocial Re-Evaluation:   Psychosocial Discharge (Final Psychosocial Re-Evaluation):   Vocational  Rehabilitation: Provide vocational rehab assistance to qualifying candidates.   Vocational Rehab Evaluation & Intervention:  Vocational Rehab - 05/21/21 1649       Initial Vocational Rehab Evaluation & Intervention   Assessment shows need for Vocational Rehabilitation No   Melissa Barron is retired and does not need vocational rehab at this time.            Education: Education Goals: Education classes will be provided on a weekly basis, covering required topics. Participant will state understanding/return demonstration of topics presented.  Learning Barriers/Preferences:  Learning Barriers/Preferences - 05/21/21 1648       Learning Barriers/Preferences   Learning Barriers Inability to learn new things;Hearing;Sight   Wears reading glasses, very HOH has bilateral hearing aides. Says has some memory deficits   Learning Preferences Skilled Demonstration;Verbal Instruction;Individual Instruction;Audio             Education Topics: Hypertension, Hypertension Reduction -Define heart disease and high blood pressure. Discus how high blood pressure affects the body and ways to reduce high blood pressure.   Exercise and Your Heart -Discuss why it is important to exercise, the FITT principles of exercise, normal and abnormal responses to exercise, and how to exercise safely.   Angina -Discuss definition of angina, causes of angina, treatment of angina, and how to decrease risk of having angina.   Cardiac Medications -Review what the following cardiac medications are used for, how they affect the body, and side effects that may occur when taking the medications.  Medications include Aspirin, Beta blockers, calcium channel blockers, ACE Inhibitors, angiotensin receptor blockers, diuretics, digoxin, and antihyperlipidemics.   Congestive Heart Failure -Discuss the definition of CHF, how to live with CHF, the signs and symptoms of CHF, and how keep track of weight and sodium  intake.   Heart Disease and Intimacy -Discus the effect sexual activity has on the heart, how changes occur during intimacy as we age, and safety during sexual activity.   Smoking Cessation / COPD -Discuss different methods to quit smoking, the health benefits of quitting smoking, and the definition of COPD.   Nutrition I: Fats -Discuss the  types of cholesterol, what cholesterol does to the heart, and how cholesterol levels can be controlled.   Nutrition II: Labels -Discuss the different components of food labels and how to read food label   Heart Parts/Heart Disease and PAD -Discuss the anatomy of the heart, the pathway of blood circulation through the heart, and these are affected by heart disease.   Stress I: Signs and Symptoms -Discuss the causes of stress, how stress may lead to anxiety and depression, and ways to limit stress.   Stress II: Relaxation -Discuss different types of relaxation techniques to limit stress.   Warning Signs of Stroke / TIA -Discuss definition of a stroke, what the signs and symptoms are of a stroke, and how to identify when someone is having stroke.   Knowledge Questionnaire Score:  Knowledge Questionnaire Score - 05/21/21 1555       Knowledge Questionnaire Score   Pre Score 20/24             Core Components/Risk Factors/Patient Goals at Admission:  Personal Goals and Risk Factors at Admission - 05/21/21 1343       Core Components/Risk Factors/Patient Goals on Admission   Improve shortness of breath with ADL's Yes    Intervention Provide education, individualized exercise plan and daily activity instruction to help decrease symptoms of SOB with activities of daily living.    Expected Outcomes Short Term: Improve cardiorespiratory fitness to achieve a reduction of symptoms when performing ADLs;Long Term: Be able to perform more ADLs without symptoms or delay the onset of symptoms    Hypertension Yes    Intervention Provide  education on lifestyle modifcations including regular physical activity/exercise, weight management, moderate sodium restriction and increased consumption of fresh fruit, vegetables, and low fat dairy, alcohol moderation, and smoking cessation.;Monitor prescription use compliance.    Expected Outcomes Short Term: Continued assessment and intervention until BP is < 140/62m HG in hypertensive participants. < 130/828mHG in hypertensive participants with diabetes, heart failure or chronic kidney disease.;Long Term: Maintenance of blood pressure at goal levels.    Lipids Yes    Intervention Provide education and support for participant on nutrition & aerobic/resistive exercise along with prescribed medications to achieve LDL <7016mHDL >2m50m  Expected Outcomes Short Term: Participant states understanding of desired cholesterol values and is compliant with medications prescribed. Participant is following exercise prescription and nutrition guidelines.;Long Term: Cholesterol controlled with medications as prescribed, with individualized exercise RX and with personalized nutrition plan. Value goals: LDL < 70mg3mL > 40 mg.    Stress Yes    Intervention Offer individual and/or small group education and counseling on adjustment to heart disease, stress management and health-related lifestyle change. Teach and support self-help strategies.;Refer participants experiencing significant psychosocial distress to appropriate mental health specialists for further evaluation and treatment. When possible, include family members and significant others in education/counseling sessions.    Expected Outcomes Short Term: Participant demonstrates changes in health-related behavior, relaxation and other stress management skills, ability to obtain effective social support, and compliance with psychotropic medications if prescribed.;Long Term: Emotional wellbeing is indicated by absence of clinically significant psychosocial  distress or social isolation.             Core Components/Risk Factors/Patient Goals Review:    Core Components/Risk Factors/Patient Goals at Discharge (Final Review):    ITP Comments:  ITP Comments     Row Name 05/21/21 1322           ITP Comments Medical Director- Dr.  Fransico Him, MD                Comments: Melissa Barron  attended orientation on 05/21/2021 to review rules and guidelines for program.  Completed 6 minute walk test, Intitial ITP, and exercise prescription.  VSS. Telemetry-Sinus Rhythm first degree heart block.Melissa Barron reported having 2/4 dyspnea and chest tightness 2/10 please see previous note for documentation. Safety measures and social distancing in place per CDC guidelines. Shandel's exercise is currently on hold until after seen by Dr Virgina Jock and cleared to begin exercise.Barnet Pall, RN,BSN 05/21/2021 4:54 PM

## 2021-05-21 NOTE — Progress Notes (Addendum)
Melissa Barron reported experiencing chest tightness during her 6 minute walk test on 3l/min. Melissa Barron's oxygen saturations were 96% pre walk test. Oxygen saturations went as low as 86% on 3l/min. Melissa Barron did not stop to take any rest breaks during her walk test. Melissa Barron described the chest discomfort a 2/10 denied having actual chest pain. Upon assessment lung fields with scattered expiratory wheezes. No peripheral edema noted. Melissa Barron said the discomfort went away after rest. Melissa Barron says she experiences chest tightness if she exerts herself too much. Blood pressure post walk test 122/48 heart rate 57. Dr Bonney Roussel office called and notified about today's events.Spoke with Melissa Barron. Faxed today's ECG tracing with today's vital to Dr. Bonney Roussel office for review. Dr Virgina Jock said for Melissa Barron to hold off on starting exercise until she is evaluated in the office. Melissa Barron gave Melissa Barron and appointment to see Dr Virgina Jock on 06/04/21 at 3:00 pm. Melissa Barron came into cardiac rehab without her oxygen on this afternoon. I reminded [name] to wear her oxygen at all times per her pulmonologist. Patient states understanding. Melissa Barron left cardiac rehab without complaints or symptoms. I walked the patient to her car on oxygen where she had her portable tank in the car.Melissa Pall, RN,BSN 05/21/2021 3:57 PM

## 2021-05-23 DIAGNOSIS — J449 Chronic obstructive pulmonary disease, unspecified: Secondary | ICD-10-CM | POA: Diagnosis not present

## 2021-05-24 ENCOUNTER — Encounter: Payer: Self-pay | Admitting: Cardiology

## 2021-05-24 ENCOUNTER — Ambulatory Visit: Payer: Medicare HMO | Admitting: Cardiology

## 2021-05-24 ENCOUNTER — Other Ambulatory Visit: Payer: Self-pay

## 2021-05-24 VITALS — BP 132/52 | HR 57 | Temp 98.0°F | Resp 16 | Ht 59.0 in | Wt 113.0 lb

## 2021-05-24 DIAGNOSIS — I10 Essential (primary) hypertension: Secondary | ICD-10-CM

## 2021-05-24 DIAGNOSIS — I5032 Chronic diastolic (congestive) heart failure: Secondary | ICD-10-CM | POA: Diagnosis not present

## 2021-05-24 DIAGNOSIS — I25118 Atherosclerotic heart disease of native coronary artery with other forms of angina pectoris: Secondary | ICD-10-CM

## 2021-05-24 NOTE — Progress Notes (Signed)
Patient referred by [name], MD for coronary artery disease  Subjective:   Melissa Barron, female    DOB: 06-13-1931, 87 y.o.   MRN: [id number]   Chief Complaint  Patient presents with   Coronary artery disease involving native coronary artery of   Hypertension   Chest Pain    87 y.o. Caucasian female with hypertension, hyperlipidemia, CAD, HFpEF, COPD, depression, GERD  Patient has recently experienced retrosternal chest tightness, also radiating to back, worse with exertion and better with rest.  Her dyspnea has not been as prominent recently.  On further questioning, patient does have some similarities to her pain prior to her PCI for LAD stenosis in 11/2020  Current Outpatient Medications on File Prior to Visit  Medication Sig Dispense Refill   amLODipine (NORVASC) 10 MG tablet Take 1 tablet (10 mg total) by mouth daily. 90 tablet 3   aspirin EC 81 MG tablet Take 1 tablet (81 mg total) by mouth daily. Swallow whole. 90 tablet 3   atorvastatin (LIPITOR) 80 MG tablet Take 80 mg by mouth daily.     bisoprolol (ZEBETA) 5 MG tablet Take 1 tablet (5 mg total) by mouth daily. 90 tablet 3   Cholecalciferol (VITAMIN D3) 250 MCG (10000 UT) capsule Take 1 capsule (10,000 Units total) by mouth daily.     clopidogrel (PLAVIX) 75 MG tablet Take 1 tablet (75 mg total) by mouth daily. 30 tablet 1   denosumab (PROLIA) 60 MG/ML SOSY injection Inject 60 mg into the skin every 6 (six) months.     furosemide (LASIX) 20 MG tablet Take 1 tablet (20 mg total) by mouth daily. 90 tablet 1   furosemide (LASIX) 40 MG tablet TAKE 1 TABLET BY MOUTH EVERY DAY 90 tablet 1   hydroxyurea (HYDREA) 500 MG capsule Take 1 capsule (500 mg total) by mouth daily. Mon through Friday. Off on weekends (Patient taking differently: Take 500 mg by mouth See admin instructions. Take 546m daily on Monday through Friday only. Off on weekends) 90 capsule 3   irbesartan (AVAPRO) 300 MG tablet Take 1 tablet (300 mg total) by  mouth at bedtime. 90 tablet 3   isosorbide mononitrate (IMDUR) 30 MG 24 hr tablet Take 1 tablet (30 mg total) by mouth daily. 90 tablet 3   KLOR-CON M20 20 MEQ tablet TAKE 1 TABLET BY MOUTH EVERY DAY 90 tablet 1   nitroGLYCERIN (NITROSTAT) 0.4 MG SL tablet Place 1 tablet (0.4 mg total) under the tongue every 5 (five) minutes x 3 doses as needed for chest pain. 25 tablet 3   pantoprazole (PROTONIX) 40 MG tablet Take 1 tablet (40 mg total) by mouth daily. 90 tablet 3   sertraline (ZOLOFT) 100 MG tablet Take 100 mg by mouth daily.     Tiotropium Bromide-Olodaterol (STIOLTO RESPIMAT) 2.5-2.5 MCG/ACT AERS Inhale 2 puffs into the lungs daily. 4 g 3   No current facility-administered medications on file prior to visit.    Cardiovascular and other pertinent studies:  EKG 05/24/2021: Sinus rhythm 56 bpm First degree A-V block  Anteroseptal infarct -age undetermined Nonspecific T wave inversion lead III   Echocardiogram 01/03/2021: Left ventricle cavity is normal in size. Moderate concentric hypertrophy of the left ventricle. Normal global wall motion. Normal LV systolic function with EF 63%. Doppler evidence of grade I (impaired) diastolic dysfunction, normal LAP.  Left atrial cavity is moderately dilated. Trileaflet aortic valve with moderate calcification. Transvalvular mean PG 28 mmHg, max PG 44 mmHg, likely originating  predominantly from LVOT than true valvular stenosis.  Moderate (grade II) aortic regurgitation. Mild tricuspid regurgitation. Estimated pulmonary artery systolic pressure 37 mmHg.  No significant change compared to previous study on 06/26/2020.   Coronary intervention 12/28/2020: LM: Distal 20% stenosis LAD: Tortuous vessel        Prox-mid prior stent with focal 80% ISR        Attempted orbital atherectomy        Shockwave lithotripsy        PTCA and stent placement 3.5 X 12 mm Synergy drug-eluting stent        Intravascular ultrasound (IVUS) MSA 7.1 mm2        80%-->0%  residual stenosis        TIMI flow II-->III LCx: Tortuous vessel          Mid focal 40% stenosis RCA: Prox-mid prior stent         Mid 40% ISR         RV marginal branch with prox 90% diffuse disease   LV-Ao Peak-to-peak gradient 21 mmHg LV-Ao Mean PG 20 mmHg Moderate aortic stenosis   Carotid artery duplex 06/26/2020:  Doppler velocity suggests stenosis in the right internal carotid artery  (16-49%). Stenosis in the right external carotid artery (<50%).  Doppler velocity suggests stenosis in the left internal carotid artery  (50-69%). Stenosis in the left external carotid artery (<50%).  Antegrade right vertebral artery flow. Antegrade left vertebral artery  flow.  Follow up in six months is appropriate if clinically indicated.  Recent labs: 02/14/2021: BNP 790 high  01/03/2021: H/H 13.7/42.4. MCV 103.9. Platelets 254  05/12/2020: Glucose 80, BUN/Cr 17/0.7. EGFR 79. Na/K 137/4.1. Rest of the CMP normal H/H 14/44. MCV 106. Platelets 280 HbA1C N/A Chol 124, TG 106, HDL 42, LDL 61 TSH N/A    Review of Systems  Cardiovascular:  Positive for chest pain and dyspnea on exertion (improved). Negative for leg swelling, palpitations and syncope.  Respiratory:  Positive for shortness of breath (improved).         Vitals:   05/24/21 1404  BP: (!) 132/52  Pulse: (!) 57  Resp: 16  Temp: 98 F (36.7 C)  SpO2: 92%    Body mass index is 22.82 kg/m. Filed Weights   05/24/21 1404  Weight: 113 lb (51.3 kg)      Objective:   Physical Exam Vitals and nursing note reviewed.  Constitutional:      General: She is not in acute distress. Neck:     Vascular: No JVD.  Cardiovascular:     Rate and Rhythm: Normal rate and regular rhythm.     Pulses:          Carotid pulses are  on the right side with bruit.    Heart sounds: Murmur heard.  Harsh midsystolic murmur is present with a grade of 3/6 at the upper right sternal border radiating to the neck.  Pulmonary:     Effort:  Pulmonary effort is normal.     Breath sounds: Examination of the right-lower field reveals rales. Examination of the left-lower field reveals rales. Rales present. No wheezing.  Musculoskeletal:     Right lower leg: No edema.     Left lower leg: No edema.        Assessment & Recommendations:   87 y.o. Caucasian female with hypertension, hyperlipidemia, CAD, HFpEF, COPD, depression, GERD  CAD: Recent worsening of possible angina symptoms.  History of LAD restenosis, last PCI 11/2020. Continue DAPT,  beta-blocker, statin. Will obtain Lexiscan nuclear stress test, patient unable to walk on treadmill due to balance and orthopedic issues.  HFpEF: Relatively 1 week. Will check CBC, BMP, BNP  Hypertension: Controlled.   F/u in 3 months   Nigel Mormon, MD Pager: [phone] Office: [phone]

## 2021-05-27 ENCOUNTER — Other Ambulatory Visit: Payer: Self-pay | Admitting: Cardiology

## 2021-05-27 ENCOUNTER — Ambulatory Visit (HOSPITAL_COMMUNITY): Payer: Medicare HMO

## 2021-05-27 DIAGNOSIS — I2510 Atherosclerotic heart disease of native coronary artery without angina pectoris: Secondary | ICD-10-CM

## 2021-05-29 ENCOUNTER — Telehealth: Payer: Self-pay | Admitting: Hematology and Oncology

## 2021-05-29 ENCOUNTER — Ambulatory Visit (HOSPITAL_COMMUNITY): Payer: Medicare HMO

## 2021-05-29 NOTE — Telephone Encounter (Signed)
Scheduled per provider. Left message.

## 2021-05-31 ENCOUNTER — Ambulatory Visit (HOSPITAL_COMMUNITY): Payer: Medicare HMO

## 2021-06-03 ENCOUNTER — Ambulatory Visit (HOSPITAL_COMMUNITY): Payer: Medicare HMO

## 2021-06-04 ENCOUNTER — Ambulatory Visit: Payer: Medicare HMO | Admitting: Cardiology

## 2021-06-05 ENCOUNTER — Ambulatory Visit (HOSPITAL_COMMUNITY): Payer: Medicare HMO

## 2021-06-07 ENCOUNTER — Encounter (HOSPITAL_COMMUNITY): Payer: Self-pay | Admitting: *Deleted

## 2021-06-07 ENCOUNTER — Ambulatory Visit (HOSPITAL_COMMUNITY): Payer: Medicare HMO

## 2021-06-07 DIAGNOSIS — Z955 Presence of coronary angioplasty implant and graft: Secondary | ICD-10-CM

## 2021-06-07 NOTE — Progress Notes (Signed)
Cardiac Individual Treatment Plan  Patient Details  Name: Melissa Barron MRN: [id number] Date of Birth: 20-Apr-1931 Referring Provider:   Flowsheet Row CARDIAC REHAB PHASE II ORIENTATION from 05/21/2021 in [place]  Referring Provider Patwardhan, Reynold Bowen, MD       Initial Encounter Date:  [address] from 05/21/2021 in [place]  Date 05/21/21       Visit Diagnosis: 12/28/20 DES LAD  Patient's Home Medications on Admission:  Current Outpatient Medications:    amLODipine (NORVASC) 10 MG tablet, Take 1 tablet (10 mg total) by mouth daily., Disp: 90 tablet, Rfl: 3   aspirin EC 81 MG tablet, Take 1 tablet (81 mg total) by mouth daily. Swallow whole., Disp: 90 tablet, Rfl: 3   atorvastatin (LIPITOR) 80 MG tablet, Take 80 mg by mouth daily., Disp: , Rfl:    bisoprolol (ZEBETA) 5 MG tablet, Take 1 tablet (5 mg total) by mouth daily., Disp: 90 tablet, Rfl: 3   Cholecalciferol (VITAMIN D3) 250 MCG (10000 UT) capsule, Take 1 capsule (10,000 Units total) by mouth daily., Disp: , Rfl:    clopidogrel (PLAVIX) 75 MG tablet, TAKE 1 TABLET EVERY DAY, Disp: 180 tablet, Rfl: 1   denosumab (PROLIA) 60 MG/ML SOSY injection, Inject 60 mg into the skin every 6 (six) months., Disp: , Rfl:    furosemide (LASIX) 20 MG tablet, Take 1 tablet (20 mg total) by mouth daily., Disp: 90 tablet, Rfl: 1   furosemide (LASIX) 40 MG tablet, TAKE 1 TABLET BY MOUTH EVERY DAY, Disp: 90 tablet, Rfl: 1   hydroxyurea (HYDREA) 500 MG capsule, Take 1 capsule (500 mg total) by mouth daily. Mon through Friday. Off on weekends (Patient taking differently: Take 500 mg by mouth See admin instructions. Take 524m daily on Monday through Friday only. Off on weekends), Disp: 90 capsule, Rfl: 3   irbesartan (AVAPRO) 300 MG tablet, Take 1 tablet (300 mg total) by mouth at bedtime., Disp: 90 tablet, Rfl: 3   isosorbide mononitrate (IMDUR) 30  MG 24 hr tablet, TAKE 1 TABLET (30 MG TOTAL) BY MOUTH DAILY., Disp: 90 tablet, Rfl: 1   KLOR-CON M20 20 MEQ tablet, TAKE 1 TABLET BY MOUTH EVERY DAY, Disp: 90 tablet, Rfl: 1   nitroGLYCERIN (NITROSTAT) 0.4 MG SL tablet, Place 1 tablet (0.4 mg total) under the tongue every 5 (five) minutes x 3 doses as needed for chest pain., Disp: 25 tablet, Rfl: 3   pantoprazole (PROTONIX) 40 MG tablet, Take 1 tablet (40 mg total) by mouth daily., Disp: 90 tablet, Rfl: 3   sertraline (ZOLOFT) 100 MG tablet, Take 100 mg by mouth daily., Disp: , Rfl:    Tiotropium Bromide-Olodaterol (STIOLTO RESPIMAT) 2.5-2.5 MCG/ACT AERS, Inhale 2 puffs into the lungs daily., Disp: 4 g, Rfl: 3  Past Medical History: Past Medical History:  Diagnosis Date   Coronary artery disease    [place]   [place]   Hyperlipidemia    Hypertension     Tobacco Use: Social History   Tobacco Use  Smoking Status Former   Packs/day: 1.50   Years: 30.00   Pack years: 45.00   Types: Cigarettes   Quit date: 1980   Years since quitting: 42.7  Smokeless Tobacco Never    Labs: Recent Review FManagement consultantfor ITP Cardiac and Pulmonary Rehab Latest Ref Rng & Units 12/27/2020  Cholestrol 0 - 200 mg/dL 122   LDLCALC 0 - 99 mg/dL 65   HDL >40 mg/dL 42   Trlycerides <150 mg/dL 74       Capillary Blood Glucose: No results found for: GLUCAP   Exercise Target Goals: Exercise Program Goal: Individual exercise prescription set using results from initial 6 min walk test and THRR while considering  patient's activity barriers and safety.   Exercise Prescription Goal: Starting with aerobic activity 30 plus minutes a day, 3 days per week for initial exercise prescription. Provide home exercise prescription and guidelines that participant acknowledges understanding prior to discharge.  Activity Barriers & Risk Stratification:  Activity Barriers & Cardiac Risk Stratification -  05/21/21 1343       Activity Barriers & Cardiac Risk Stratification   Activity Barriers Shortness of Breath;Balance Concerns;History of Falls    Cardiac Risk Stratification High             6 Minute Walk:  6 Minute Walk     Row Name 05/21/21 1402         6 Minute Walk   Phase Initial     Distance 800 feet     Walk Time 6 minutes     # of Rest Breaks 0     MPH 1.51     METS 0.79     RPE 11     Perceived Dyspnea  2     VO2 Peak 2.77     Symptoms Yes (comment)     Comments Patient c/o chest tightness, and mild shortness of breath, 2/4 on dyspnea scale.     Resting HR 57 bpm     Resting BP 108/42     Resting Oxygen Saturation  95 %     Exercise Oxygen Saturation  during 6 min walk 86 %     Max Ex. HR 69 bpm     Max Ex. BP 118/52     2 Minute Post BP 122/48           Interval Oxygen   Interval Oxygen? Yes     Baseline Oxygen Saturation % 95 %     1 Minute Oxygen Saturation % 96 %     1 Minute Liters of Oxygen 3 L     2 Minute Oxygen Saturation % 93 %     2 Minute Liters of Oxygen 3 L     3 Minute Oxygen Saturation % 92 %     3 Minute Liters of Oxygen 3 L     4 Minute Oxygen Saturation % 89 %     4 Minute Liters of Oxygen 3 L     5 Minute Oxygen Saturation % 86 %     5 Minute Liters of Oxygen 3 L     6 Minute Oxygen Saturation % 86 %     6 Minute Liters of Oxygen 3 L     2 Minute Post Oxygen Saturation % 86 %     2 Minute Post Liters of Oxygen 3 L              Oxygen Initial Assessment:   Oxygen Re-Evaluation:   Oxygen Discharge (Final Oxygen Re-Evaluation):   Initial Exercise Prescription:  Initial Exercise Prescription - 05/21/21 1500       Date of Initial Exercise RX and Referring Provider   Date 05/21/21    Referring Provider Nigel Mormon, MD    Expected Discharge Date 07/19/21  Oxygen   Oxygen Continuous    Liters 2-3    Maintain Oxygen Saturation 88% or higher      Recumbant Bike   Level 1    Minutes 15    METs 1.5       NuStep   Level 1    SPM 80    Minutes 15    METs 1.5      Prescription Details   Frequency (times per week) 2    Duration Progress to 30 minutes of continuous aerobic without signs/symptoms of physical distress      Intensity   THRR 40-80% of Max Heartrate 53-106    Ratings of Perceived Exertion 11-13    Perceived Dyspnea 0-4      Progression   Progression Continue to progress workloads to maintain intensity without signs/symptoms of physical distress.      Resistance Training   Training Prescription Yes    Weight 2 lbs    Reps 10-15             Perform Capillary Blood Glucose checks as needed.  Exercise Prescription Changes:   Exercise Comments:   Exercise Goals and Review:   Exercise Goals     Row Name 05/21/21 1343             Exercise Goals   Increase Physical Activity Yes       Intervention Develop an individualized exercise prescription for aerobic and resistive training based on initial evaluation findings, risk stratification, comorbidities and participant's personal goals.;Provide advice, education, support and counseling about physical activity/exercise needs.       Expected Outcomes Short Term: Attend rehab on a regular basis to increase amount of physical activity.;Long Term: Exercising regularly at least 3-5 days a week.;Long Term: Add in home exercise to make exercise part of routine and to increase amount of physical activity.       Increase Strength and Stamina Yes       Intervention Provide advice, education, support and counseling about physical activity/exercise needs.;Develop an individualized exercise prescription for aerobic and resistive training based on initial evaluation findings, risk stratification, comorbidities and participant's personal goals.       Expected Outcomes Short Term: Increase workloads from initial exercise prescription for resistance, speed, and METs.;Short Term: Perform resistance training exercises routinely  during rehab and add in resistance training at home;Long Term: Improve cardiorespiratory fitness, muscular endurance and strength as measured by increased METs and functional capacity (6MWT)       Able to understand and use rate of perceived exertion (RPE) scale Yes       Intervention Provide education and explanation on how to use RPE scale       Expected Outcomes Short Term: Able to use RPE daily in rehab to express subjective intensity level;Long Term:  Able to use RPE to guide intensity level when exercising independently       Able to understand and use Dyspnea scale Yes       Intervention Provide education and explanation on how to use Dyspnea scale       Expected Outcomes Short Term: Able to use Dyspnea scale daily in rehab to express subjective sense of shortness of breath during exertion;Long Term: Able to use Dyspnea scale to guide intensity level when exercising independently       Knowledge and understanding of Target Heart Rate Range (THRR) Yes       Intervention Provide education and explanation of THRR including how the numbers were predicted  and where they are located for reference       Expected Outcomes Short Term: Able to state/look up THRR;Long Term: Able to use THRR to govern intensity when exercising independently;Short Term: Able to use daily as guideline for intensity in rehab       Able to check pulse independently Yes       Intervention Provide education and demonstration on how to check pulse in carotid and radial arteries.;Review the importance of being able to check your own pulse for safety during independent exercise       Expected Outcomes Short Term: Able to explain why pulse checking is important during independent exercise;Long Term: Able to check pulse independently and accurately       Understanding of Exercise Prescription Yes       Intervention Provide education, explanation, and written materials on patient's individual exercise prescription       Expected  Outcomes Short Term: Able to explain program exercise prescription;Long Term: Able to explain home exercise prescription to exercise independently                Exercise Goals Re-Evaluation :    Discharge Exercise Prescription (Final Exercise Prescription Changes):   Nutrition:  Target Goals: Understanding of nutrition guidelines, daily intake of sodium <1556m, cholesterol <2073m calories 30% from fat and 7% or less from saturated fats, daily to have 5 or more servings of fruits and vegetables.  Biometrics:  Pre Biometrics - 05/21/21 1322       Pre Biometrics   Waist Circumference 30 inches    Hip Circumference 37 inches    Waist to Hip Ratio 0.81 %    Triceps Skinfold 18 mm    % Body Fat 33.9 %    Grip Strength 19 kg    Flexibility 11.75 in    Single Leg Stand 13.87 seconds              Nutrition Therapy Plan and Nutrition Goals:   Nutrition Assessments:  MEDIFICTS Score Key: ?70 Need to make dietary changes  40-70 Heart Healthy Diet ? 40 Therapeutic Level Cholesterol Diet   Picture Your Plate Scores: <4<56nhealthy dietary pattern with much room for improvement. 41-50 Dietary pattern unlikely to meet recommendations for good health and room for improvement. 51-60 More healthful dietary pattern, with some room for improvement.  >60 Healthy dietary pattern, although there may be some specific behaviors that could be improved.    Nutrition Goals Re-Evaluation:   Nutrition Goals Discharge (Final Nutrition Goals Re-Evaluation):   Psychosocial: Target Goals: Acknowledge presence or absence of significant depression and/or stress, maximize coping skills, provide positive support system. Participant is able to verbalize types and ability to use techniques and skills needed for reducing stress and depression.  Initial Review & Psychosocial Screening:  Initial Psych Review & Screening - 05/21/21 1646       Initial Review   Current issues with History  of Depression;Current Stress Concerns    Source of Stress Concerns Chronic Illness;Unable to participate in former interests or hobbies;Unable to perform yard/household activities    Comments AnAldineays that she has been on an antidepressant for over 30 years says her depression is under control      FaFleetwoodYes   AnAmorias her husband and children for support. 4 boys     Barriers   Psychosocial barriers to participate in program The patient should benefit from training in stress management and relaxation.  Screening Interventions   Interventions Encouraged to exercise    Expected Outcomes Long Term goal: The participant improves quality of Life and PHQ9 Scores as seen by post scores and/or verbalization of changes;Long Term Goal: Stressors or current issues are controlled or eliminated.             Quality of Life Scores:  Scores of 19 and below usually indicate a poorer quality of life in these areas.  A difference of  2-3 points is a clinically meaningful difference.  A difference of 2-3 points in the total score of the Quality of Life Index has been associated with significant improvement in overall quality of life, self-image, physical symptoms, and general health in studies assessing change in quality of life.  PHQ-9: Recent Review Flowsheet Data     Depression screen [facility] 2/9 05/21/2021   Decreased Interest 0   Down, Depressed, Hopeless 0   PHQ - 2 Score 0      Interpretation of Total Score  Total Score Depression Severity:  1-4 = Minimal depression, 5-9 = Mild depression, 10-14 = Moderate depression, 15-19 = Moderately severe depression, 20-27 = Severe depression   Psychosocial Evaluation and Intervention:   Psychosocial Re-Evaluation:   Psychosocial Discharge (Final Psychosocial Re-Evaluation):   Vocational Rehabilitation: Provide vocational rehab assistance to qualifying candidates.   Vocational Rehab Evaluation &  Intervention:  Vocational Rehab - 05/21/21 1649       Initial Vocational Rehab Evaluation & Intervention   Assessment shows need for Vocational Rehabilitation No   Keymora is retired and does not need vocational rehab at this time.            Education: Education Goals: Education classes will be provided on a weekly basis, covering required topics. Participant will state understanding/return demonstration of topics presented.  Learning Barriers/Preferences:  Learning Barriers/Preferences - 05/21/21 1648       Learning Barriers/Preferences   Learning Barriers Inability to learn new things;Hearing;Sight   Wears reading glasses, very HOH has bilateral hearing aides. Says has some memory deficits   Learning Preferences Skilled Demonstration;Verbal Instruction;Individual Instruction;Audio             Education Topics: Hypertension, Hypertension Reduction -Define heart disease and high blood pressure. Discus how high blood pressure affects the body and ways to reduce high blood pressure.   Exercise and Your Heart -Discuss why it is important to exercise, the FITT principles of exercise, normal and abnormal responses to exercise, and how to exercise safely.   Angina -Discuss definition of angina, causes of angina, treatment of angina, and how to decrease risk of having angina.   Cardiac Medications -Review what the following cardiac medications are used for, how they affect the body, and side effects that may occur when taking the medications.  Medications include Aspirin, Beta blockers, calcium channel blockers, ACE Inhibitors, angiotensin receptor blockers, diuretics, digoxin, and antihyperlipidemics.   Congestive Heart Failure -Discuss the definition of CHF, how to live with CHF, the signs and symptoms of CHF, and how keep track of weight and sodium intake.   Heart Disease and Intimacy -Discus the effect sexual activity has on the heart, how changes occur during intimacy  as we age, and safety during sexual activity.   Smoking Cessation / COPD -Discuss different methods to quit smoking, the health benefits of quitting smoking, and the definition of COPD.   Nutrition I: Fats -Discuss the types of cholesterol, what cholesterol does to the heart, and how cholesterol levels can be controlled.  Nutrition II: Labels -Discuss the different components of food labels and how to read food label   Heart Parts/Heart Disease and PAD -Discuss the anatomy of the heart, the pathway of blood circulation through the heart, and these are affected by heart disease.   Stress I: Signs and Symptoms -Discuss the causes of stress, how stress may lead to anxiety and depression, and ways to limit stress.   Stress II: Relaxation -Discuss different types of relaxation techniques to limit stress.   Warning Signs of Stroke / TIA -Discuss definition of a stroke, what the signs and symptoms are of a stroke, and how to identify when someone is having stroke.   Knowledge Questionnaire Score:  Knowledge Questionnaire Score - 05/21/21 1555       Knowledge Questionnaire Score   Pre Score 20/24             Core Components/Risk Factors/Patient Goals at Admission:  Personal Goals and Risk Factors at Admission - 05/21/21 1343       Core Components/Risk Factors/Patient Goals on Admission   Improve shortness of breath with ADL's Yes    Intervention Provide education, individualized exercise plan and daily activity instruction to help decrease symptoms of SOB with activities of daily living.    Expected Outcomes Short Term: Improve cardiorespiratory fitness to achieve a reduction of symptoms when performing ADLs;Long Term: Be able to perform more ADLs without symptoms or delay the onset of symptoms    Hypertension Yes    Intervention Provide education on lifestyle modifcations including regular physical activity/exercise, weight management, moderate sodium restriction and  increased consumption of fresh fruit, vegetables, and low fat dairy, alcohol moderation, and smoking cessation.;Monitor prescription use compliance.    Expected Outcomes Short Term: Continued assessment and intervention until BP is < 140/47m HG in hypertensive participants. < 130/836mHG in hypertensive participants with diabetes, heart failure or chronic kidney disease.;Long Term: Maintenance of blood pressure at goal levels.    Lipids Yes    Intervention Provide education and support for participant on nutrition & aerobic/resistive exercise along with prescribed medications to achieve LDL <7079mHDL >48m38m  Expected Outcomes Short Term: Participant states understanding of desired cholesterol values and is compliant with medications prescribed. Participant is following exercise prescription and nutrition guidelines.;Long Term: Cholesterol controlled with medications as prescribed, with individualized exercise RX and with personalized nutrition plan. Value goals: LDL < 70mg62mL > 40 mg.    Stress Yes    Intervention Offer individual and/or small group education and counseling on adjustment to heart disease, stress management and health-related lifestyle change. Teach and support self-help strategies.;Refer participants experiencing significant psychosocial distress to appropriate mental health specialists for further evaluation and treatment. When possible, include family members and significant others in education/counseling sessions.    Expected Outcomes Short Term: Participant demonstrates changes in health-related behavior, relaxation and other stress management skills, ability to obtain effective social support, and compliance with psychotropic medications if prescribed.;Long Term: Emotional wellbeing is indicated by absence of clinically significant psychosocial distress or social isolation.             Core Components/Risk Factors/Patient Goals Review:    Core Components/Risk  Factors/Patient Goals at Discharge (Final Review):    ITP Comments:  ITP Comments     Row Name 05/21/21 1322 06/07/21 1206         ITP Comments Medical Director- Dr. TraciFransico Him30 Day ITP Review. [facility] is currently on hold pending clearance to start  per Dr Virgina Jock.               Comments: See ITP comments.Harrell Gave RN BSN

## 2021-06-10 ENCOUNTER — Ambulatory Visit (HOSPITAL_COMMUNITY): Payer: Medicare HMO

## 2021-06-10 DIAGNOSIS — I2510 Atherosclerotic heart disease of native coronary artery without angina pectoris: Secondary | ICD-10-CM | POA: Diagnosis not present

## 2021-06-10 DIAGNOSIS — I5032 Chronic diastolic (congestive) heart failure: Secondary | ICD-10-CM | POA: Diagnosis not present

## 2021-06-11 LAB — BASIC METABOLIC PANEL
BUN/Creatinine Ratio: 19 (ref 12–28)
BUN: 19 mg/dL (ref 8–27)
CO2: 23 mmol/L (ref 20–29)
Calcium: 9.1 mg/dL (ref 8.7–10.3)
Chloride: 105 mmol/L (ref 96–106)
Creatinine, Ser: 1.01 mg/dL — ABNORMAL HIGH (ref 0.57–1.00)
Glucose: 91 mg/dL (ref 70–99)
Potassium: 4.5 mmol/L (ref 3.5–5.2)
Sodium: 142 mmol/L (ref 134–144)
eGFR: 54 mL/min/{1.73_m2} — ABNORMAL LOW (ref >59)

## 2021-06-11 LAB — CBC
Hematocrit: 49.7 % — ABNORMAL HIGH (ref 34.0–46.6)
Hemoglobin: 16.2 g/dL — ABNORMAL HIGH (ref 11.1–15.9)
MCH: 28.6 pg (ref 26.6–33.0)
MCHC: 32.6 g/dL (ref 31.5–35.7)
MCV: 88 fL (ref 79–97)
Platelets: 631 10*3/uL — ABNORMAL HIGH (ref 150–450)
RBC: 5.66 x10E6/uL — ABNORMAL HIGH (ref 3.77–5.28)
RDW: 15 % (ref 11.7–15.4)
WBC: 12.8 10*3/uL — ABNORMAL HIGH (ref 3.4–10.8)

## 2021-06-11 LAB — BRAIN NATRIURETIC PEPTIDE: BNP: 849.7 pg/mL — ABNORMAL HIGH (ref 0.0–100.0)

## 2021-06-12 ENCOUNTER — Other Ambulatory Visit: Payer: Self-pay

## 2021-06-12 ENCOUNTER — Ambulatory Visit (HOSPITAL_COMMUNITY): Payer: Medicare HMO

## 2021-06-12 ENCOUNTER — Ambulatory Visit: Payer: Medicare HMO

## 2021-06-12 DIAGNOSIS — I25118 Atherosclerotic heart disease of native coronary artery with other forms of angina pectoris: Secondary | ICD-10-CM

## 2021-06-14 ENCOUNTER — Ambulatory Visit (HOSPITAL_COMMUNITY): Payer: Medicare HMO

## 2021-06-17 ENCOUNTER — Emergency Department (HOSPITAL_COMMUNITY): Payer: Medicare HMO

## 2021-06-17 ENCOUNTER — Ambulatory Visit (HOSPITAL_COMMUNITY): Payer: Medicare HMO

## 2021-06-17 ENCOUNTER — Encounter: Payer: Self-pay | Admitting: Cardiology

## 2021-06-17 ENCOUNTER — Encounter (HOSPITAL_COMMUNITY): Payer: Self-pay

## 2021-06-17 ENCOUNTER — Ambulatory Visit: Payer: Medicare HMO | Admitting: Cardiology

## 2021-06-17 ENCOUNTER — Ambulatory Visit (HOSPITAL_COMMUNITY)
Admission: EM | Admit: 2021-06-17 | Discharge: 2021-06-18 | Disposition: A | Discharge status: Home / Self Care | Payer: Medicare HMO | Attending: Emergency Medicine | Admitting: Emergency Medicine

## 2021-06-17 ENCOUNTER — Other Ambulatory Visit: Payer: Self-pay

## 2021-06-17 VITALS — BP 127/63 | HR 73 | Temp 97.8°F | Resp 16 | Ht 59.0 in | Wt 112.0 lb

## 2021-06-17 DIAGNOSIS — I110 Hypertensive heart disease with heart failure: Secondary | ICD-10-CM | POA: Diagnosis not present

## 2021-06-17 DIAGNOSIS — Z79899 Other long term (current) drug therapy: Secondary | ICD-10-CM | POA: Diagnosis not present

## 2021-06-17 DIAGNOSIS — I2510 Atherosclerotic heart disease of native coronary artery without angina pectoris: Secondary | ICD-10-CM | POA: Insufficient documentation

## 2021-06-17 DIAGNOSIS — R0602 Shortness of breath: Secondary | ICD-10-CM | POA: Diagnosis not present

## 2021-06-17 DIAGNOSIS — J9690 Respiratory failure, unspecified, unspecified whether with hypoxia or hypercapnia: Secondary | ICD-10-CM | POA: Diagnosis present

## 2021-06-17 DIAGNOSIS — I700 Atherosclerosis of aorta: Secondary | ICD-10-CM | POA: Insufficient documentation

## 2021-06-17 DIAGNOSIS — I25118 Atherosclerotic heart disease of native coronary artery with other forms of angina pectoris: Secondary | ICD-10-CM

## 2021-06-17 DIAGNOSIS — I5032 Chronic diastolic (congestive) heart failure: Secondary | ICD-10-CM | POA: Diagnosis not present

## 2021-06-17 DIAGNOSIS — Z7902 Long term (current) use of antithrombotics/antiplatelets: Secondary | ICD-10-CM | POA: Insufficient documentation

## 2021-06-17 DIAGNOSIS — Z87891 Personal history of nicotine dependence: Secondary | ICD-10-CM | POA: Diagnosis not present

## 2021-06-17 DIAGNOSIS — Z20822 Contact with and (suspected) exposure to covid-19: Secondary | ICD-10-CM | POA: Insufficient documentation

## 2021-06-17 DIAGNOSIS — J9621 Acute and chronic respiratory failure with hypoxia: Secondary | ICD-10-CM | POA: Diagnosis not present

## 2021-06-17 DIAGNOSIS — J449 Chronic obstructive pulmonary disease, unspecified: Secondary | ICD-10-CM | POA: Insufficient documentation

## 2021-06-17 DIAGNOSIS — F32A Depression, unspecified: Secondary | ICD-10-CM | POA: Insufficient documentation

## 2021-06-17 DIAGNOSIS — E785 Hyperlipidemia, unspecified: Secondary | ICD-10-CM | POA: Diagnosis not present

## 2021-06-17 DIAGNOSIS — I509 Heart failure, unspecified: Secondary | ICD-10-CM | POA: Diagnosis not present

## 2021-06-17 DIAGNOSIS — Z91040 Latex allergy status: Secondary | ICD-10-CM | POA: Diagnosis not present

## 2021-06-17 DIAGNOSIS — Z7982 Long term (current) use of aspirin: Secondary | ICD-10-CM | POA: Diagnosis not present

## 2021-06-17 DIAGNOSIS — K449 Diaphragmatic hernia without obstruction or gangrene: Secondary | ICD-10-CM | POA: Insufficient documentation

## 2021-06-17 DIAGNOSIS — I5030 Unspecified diastolic (congestive) heart failure: Secondary | ICD-10-CM | POA: Diagnosis not present

## 2021-06-17 DIAGNOSIS — I10 Essential (primary) hypertension: Secondary | ICD-10-CM

## 2021-06-17 DIAGNOSIS — I272 Pulmonary hypertension, unspecified: Secondary | ICD-10-CM | POA: Diagnosis not present

## 2021-06-17 DIAGNOSIS — Z7951 Long term (current) use of inhaled steroids: Secondary | ICD-10-CM | POA: Insufficient documentation

## 2021-06-17 DIAGNOSIS — K219 Gastro-esophageal reflux disease without esophagitis: Secondary | ICD-10-CM | POA: Diagnosis not present

## 2021-06-17 DIAGNOSIS — I517 Cardiomegaly: Secondary | ICD-10-CM | POA: Diagnosis not present

## 2021-06-17 LAB — COMPREHENSIVE METABOLIC PANEL
ALT: 25 U/L (ref 0–44)
AST: 24 U/L (ref 15–41)
Albumin: 3 g/dL — ABNORMAL LOW (ref 3.5–5.0)
Alkaline Phosphatase: 56 U/L (ref 38–126)
Anion gap: 6 (ref 5–15)
BUN: 20 mg/dL (ref 8–23)
CO2: 27 mmol/L (ref 22–32)
Calcium: 8.6 mg/dL — ABNORMAL LOW (ref 8.9–10.3)
Chloride: 108 mmol/L (ref 98–111)
Creatinine, Ser: 1.08 mg/dL — ABNORMAL HIGH (ref 0.44–1.00)
GFR, Estimated: 49 mL/min — ABNORMAL LOW (ref >60)
Glucose, Bld: 96 mg/dL (ref 70–99)
Potassium: 4.1 mmol/L (ref 3.5–5.1)
Sodium: 141 mmol/L (ref 135–145)
Total Bilirubin: 1 mg/dL (ref 0.3–1.2)
Total Protein: 5.9 g/dL — ABNORMAL LOW (ref 6.5–8.1)

## 2021-06-17 LAB — CBC
HCT: 48.1 % — ABNORMAL HIGH (ref 36.0–46.0)
Hemoglobin: 15.3 g/dL — ABNORMAL HIGH (ref 12.0–15.0)
MCH: 28.3 pg (ref 26.0–34.0)
MCHC: 31.8 g/dL (ref 30.0–36.0)
MCV: 89.1 fL (ref 80.0–100.0)
Platelets: 639 10*3/uL — ABNORMAL HIGH (ref 150–400)
RBC: 5.4 MIL/uL — ABNORMAL HIGH (ref 3.87–5.11)
RDW: 15.8 % — ABNORMAL HIGH (ref 11.5–15.5)
WBC: 14.3 10*3/uL — ABNORMAL HIGH (ref 4.0–10.5)
nRBC: 0 % (ref 0.0–0.2)

## 2021-06-17 LAB — LIPASE, BLOOD: Lipase: 34 U/L (ref 11–51)

## 2021-06-17 LAB — TROPONIN I (HIGH SENSITIVITY)
Troponin I (High Sensitivity): 40 ng/L — ABNORMAL HIGH (ref <18)
Troponin I (High Sensitivity): 59 ng/L — ABNORMAL HIGH (ref <18)

## 2021-06-17 LAB — BRAIN NATRIURETIC PEPTIDE: B Natriuretic Peptide: 2135.2 pg/mL — ABNORMAL HIGH (ref 0.0–100.0)

## 2021-06-17 NOTE — ED Provider Notes (Signed)
Emergency Medicine Provider Triage Evaluation Note  Melissa Barron , a 87 y.o. female  was evaluated in triage.  Pt complains of hypoxia.    Dr. Lora Havens gave report.  Patient was seen and evaluated and was noted to have sats in the 80s in the office on her regular 3 L of home oxygen.  He had to increase her oxygen to 4 L.  He is concerned for multifactorial cause of her hypoxia.  She has history of heart failure, COPD and also has known CAD.  He has scheduled her for her right heart cath tomorrow and is requesting she be admitted to medicine after further evaluation of her respiratory failure..  Review of Systems  Positive: Sob, chest pain Negative: Ble swelling  Physical Exam  BP (!) 98/49 (BP Location: Left Arm)   Pulse 74   Temp 98.6 F (37 C) (Oral)   Resp (!) 22   Ht _0  (1.499 m)   Wt 50.3 kg   SpO2 91%   BMI 22.42 kg/m  Gen:   Awake, no distress   Resp:  Normal effort  MSK:   Moves extremities without difficulty  Other:  Bibasliar rales, heart murmur noted with rrr, no ble edema  Medical Decision Making  Medically screening exam initiated at 3:28 PM.  Appropriate orders placed.  Robyn Galati was informed that the remainder of the evaluation will be completed by another provider, this initial triage assessment does not replace that evaluation, and the importance of remaining in the ED until their evaluation is complete.     Rodney Booze, PA-C 06/17/21 1531    Daleen Bo, MD 06/17/21 2046

## 2021-06-17 NOTE — Progress Notes (Signed)
Patient referred by Melissa Boston, MD for coronary artery disease  Subjective:   Melissa Barron, female    DOB: September 28, 1930, 87 y.o.   MRN: [id number]   Chief Complaint  Patient presents with   Coronary artery disease of native artery of native heart wi   Hypertension   Follow-up    2-3 weeks    87 y.o. Caucasian female with hypertension, hyperlipidemia, CAD, HFpEF, COPD, depression, GERD  Patient is here with her son today.  For the past 3 weeks, she has had progressive worsening exertional dyspnea.  She is now on 24/7 3-4 L supplemental oxygen.  She is unable to do hardly anything due to severity of her dyspnea.  She has had occasional chest pain that did improve with nitroglycerin.  However, recent stress test did not show any significant ischemia.  Reviewed recent lab results with the patient, details below.  In the office today, her oxygen saturation was down to low 80s on 3 L supplemental oxygen, which had to be increased to 4 L.   Current Outpatient Medications on File Prior to Visit  Medication Sig Dispense Refill   amLODipine (NORVASC) 10 MG tablet Take 1 tablet (10 mg total) by mouth daily. 90 tablet 3   aspirin EC 81 MG tablet Take 1 tablet (81 mg total) by mouth daily. Swallow whole. 90 tablet 3   atorvastatin (LIPITOR) 80 MG tablet Take 80 mg by mouth daily.     bisoprolol (ZEBETA) 5 MG tablet Take 1 tablet (5 mg total) by mouth daily. 90 tablet 3   Cholecalciferol (VITAMIN D3) 250 MCG (10000 UT) capsule Take 1 capsule (10,000 Units total) by mouth daily.     clopidogrel (PLAVIX) 75 MG tablet TAKE 1 TABLET EVERY DAY 180 tablet 1   denosumab (PROLIA) 60 MG/ML SOSY injection Inject 60 mg into the skin every 6 (six) months.     furosemide (LASIX) 20 MG tablet Take 1 tablet (20 mg total) by mouth daily. 90 tablet 1   furosemide (LASIX) 40 MG tablet TAKE 1 TABLET BY MOUTH EVERY DAY 90 tablet 1   hydroxyurea (HYDREA) 500 MG capsule Take 1 capsule (500 mg total) by mouth daily.  Mon through Friday. Off on weekends (Patient taking differently: Take 500 mg by mouth See admin instructions. Take 536m daily on Monday through Friday only. Off on weekends) 90 capsule 3   irbesartan (AVAPRO) 300 MG tablet Take 1 tablet (300 mg total) by mouth at bedtime. 90 tablet 3   isosorbide mononitrate (IMDUR) 30 MG 24 hr tablet TAKE 1 TABLET (30 MG TOTAL) BY MOUTH DAILY. 90 tablet 1   KLOR-CON M20 20 MEQ tablet TAKE 1 TABLET BY MOUTH EVERY DAY 90 tablet 1   nitroGLYCERIN (NITROSTAT) 0.4 MG SL tablet Place 1 tablet (0.4 mg total) under the tongue every 5 (five) minutes x 3 doses as needed for chest pain. 25 tablet 3   pantoprazole (PROTONIX) 40 MG tablet Take 1 tablet (40 mg total) by mouth daily. 90 tablet 3   sertraline (ZOLOFT) 100 MG tablet Take 100 mg by mouth daily.     Tiotropium Bromide-Olodaterol (STIOLTO RESPIMAT) 2.5-2.5 MCG/ACT AERS Inhale 2 puffs into the lungs daily. 4 g 3   No current facility-administered medications on file prior to visit.    Cardiovascular and other pertinent studies:  Lexiscan (Walking with mod Bruce)Tetrofosmin Stress Test: 06/12/2021: 1 Day Rest/Stress Protocol. Stress EKG is non-diagnostic, as this is pharmacological stress test using Lexiscan. Normal myocardial  perfusion without convincing evidence of reversible myocardial ischemia or prior infarct. Left ventricular wall thickness preserved, no regional wall motion abnormalities, calculated LVEF 79%. Low risk study. No prior studies for comparison.  EKG 05/24/2021: Sinus rhythm 56 bpm First degree A-V block  Anteroseptal infarct -age undetermined Nonspecific T wave inversion lead III  Echocardiogram 01/01/2021: Left ventricle cavity is normal in size. Moderate concentric hypertrophy of the left ventricle. Normal global wall motion. Normal LV systolic function with EF 63%. Doppler evidence of grade I (impaired) diastolic dysfunction, normal LAP.  Left atrial cavity is moderately  dilated. Trileaflet aortic valve with moderate calcification. Transvalvular mean PG 28 mmHg, max PG 44 mmHg, likely originating predominantly from LVOT than true valvular stenosis.  Moderate (grade II) aortic regurgitation. Mild tricuspid regurgitation. Estimated pulmonary artery systolic pressure 37 mmHg.  No significant change compared to previous study on 06/26/2020.   Coronary intervention 12/28/2020: LM: Distal 20% stenosis LAD: Tortuous vessel        Prox-mid prior stent with focal 80% ISR        Attempted orbital atherectomy        Shockwave lithotripsy        PTCA and stent placement 3.5 X 12 mm Synergy drug-eluting stent        Intravascular ultrasound (IVUS) MSA 7.1 mm2        80%-->0% residual stenosis        TIMI flow II-->III LCx: Tortuous vessel          Mid focal 40% stenosis RCA: Prox-mid prior stent         Mid 40% ISR         RV marginal branch with prox 90% diffuse disease   LV-Ao Peak-to-peak gradient 21 mmHg LV-Ao Mean PG 20 mmHg Moderate aortic stenosis   Carotid artery duplex 06/26/2020:  Doppler velocity suggests stenosis in the right internal carotid artery  (16-49%). Stenosis in the right external carotid artery (<50%).  Doppler velocity suggests stenosis in the left internal carotid artery  (50-69%). Stenosis in the left external carotid artery (<50%).  Antegrade right vertebral artery flow. Antegrade left vertebral artery  flow.  Follow up in six months is appropriate if clinically indicated.  Recent labs: 06/10/2021: Glucose 91, BUN/Cr 19/1.01. EGFR 54. Na/K 142/4.5.  BNP 849 H/H 16/49. MCV 88. Platelets 631  02/14/2021: BNP 790 high  01/03/2021: H/H 13.7/42.4. MCV 103.9. Platelets 254  05/12/2020: Glucose 80, BUN/Cr 17/0.7. EGFR 79. Na/K 137/4.1. Rest of the CMP normal H/H 14/44. MCV 106. Platelets 280 HbA1C N/A Chol 124, TG 106, HDL 42, LDL 61 TSH N/A    Review of Systems  Cardiovascular:  Positive for chest pain and dyspnea on  exertion (Worsening). Negative for leg swelling, palpitations and syncope.  Respiratory:  Positive for shortness of breath (Worsening).         Vitals:   06/17/21 1150  BP: 127/63  Pulse: 73  Resp: 16  Temp: 97.8 F (36.6 C)  SpO2: 93%    Body mass index is 22.62 kg/m. Filed Weights   06/17/21 1150  Weight: 112 lb (50.8 kg)      Objective:   Physical Exam Vitals and nursing note reviewed.  Constitutional:      General: She is not in acute distress.    Appearance: She is underweight.  Neck:     Vascular: No JVD.  Cardiovascular:     Rate and Rhythm: Normal rate and regular rhythm.     Pulses:  Carotid pulses are  on the right side with bruit.    Heart sounds: Murmur heard.  Harsh midsystolic murmur is present with a grade of 3/6 at the upper right sternal border radiating to the neck.  Pulmonary:     Effort: Pulmonary effort is normal.     Breath sounds: Examination of the right-lower field reveals rales. Examination of the left-lower field reveals rales. Rales present. No wheezing.  Musculoskeletal:     Right lower leg: No edema.     Left lower leg: No edema.        Assessment & Recommendations:   87 y.o. Caucasian female with hypertension, hyperlipidemia, CAD, HFpEF, COPD, depression, GERD  Acute on chronic hypoxic respiratory failure: Worsening oxygen requirement with worsening dyspnea symptoms. She does have bilateral rails on exam, she has no JVD or leg edema.  Her volume status is unclear on physical exam alone.  Recent BNP was elevated.  However, patient also has underlying COPD.  I suspect her hypoxia is multifactorial.  She will benefit from multidisciplinary work-up and management, which can best be performed in an inpatient setting.  Currently, there are no hospital beds available.  I encouraged her to go to [facility] emergency room with potential admission to medicine service for acute on chronic hypoxic respiratory failure.  From cardiac  standpoint, I recommend right heart catheterization to assess her volume status, and tailor diuretic therapy as necessary.  I will also obtain an echocardiogram to re- assess her systolic and diastolic function, as well as aortic valve function.  Clinically, I do not think aortic stenosis is anything worse than moderate and unlikely to explain her symptoms by itself.  From pulmonary standpoint, could consider CT chest to evaluate lung parenchyma.  Defer rest of the work-up to primary/pulmonary team.  Should the patient get admitted to hospital, I will continue to follow along.  I have tentatively scheduled her for right heart catheterization 06/18/2021 morning.  CAD: History of LAD restenosis, last PCI 11/2020. Occasional angina episodes without any ischemia on stress testing 06/2021. Continue DAPT, beta-blocker, statin.  Hypertension: Controlled.   F/u in 3 weeks   Nigel Mormon, MD Pager: [phone] Office: [phone]

## 2021-06-17 NOTE — ED Triage Notes (Signed)
Pt sent by cardiology for admission. Hx of CHF and COPD, worsening SOB over the past few weeks, scheduled for heart cath tomorrow. Normally wears 3L [place], bumped up to 4L in the office today, pt at 91% on the 4L. Denies chest pain. Resp e.u at this time, no swelling. Pt also having some lower abd pain and nausea.

## 2021-06-18 ENCOUNTER — Encounter (HOSPITAL_COMMUNITY): Payer: Self-pay | Admitting: Cardiology

## 2021-06-18 ENCOUNTER — Emergency Department (HOSPITAL_COMMUNITY): Payer: Medicare HMO

## 2021-06-18 ENCOUNTER — Other Ambulatory Visit: Payer: Self-pay | Admitting: Cardiology

## 2021-06-18 ENCOUNTER — Encounter (HOSPITAL_COMMUNITY)
Admission: EM | Disposition: A | Discharge status: Home / Self Care | Payer: Self-pay | Source: Home / Self Care | Attending: Emergency Medicine

## 2021-06-18 ENCOUNTER — Ambulatory Visit (HOSPITAL_COMMUNITY): Admission: RE | Admit: 2021-06-18 | Payer: Medicare HMO | Source: Home / Self Care | Admitting: Cardiology

## 2021-06-18 ENCOUNTER — Other Ambulatory Visit: Payer: Self-pay

## 2021-06-18 DIAGNOSIS — I517 Cardiomegaly: Secondary | ICD-10-CM | POA: Diagnosis not present

## 2021-06-18 DIAGNOSIS — J9690 Respiratory failure, unspecified, unspecified whether with hypoxia or hypercapnia: Secondary | ICD-10-CM | POA: Diagnosis present

## 2021-06-18 DIAGNOSIS — I10 Essential (primary) hypertension: Secondary | ICD-10-CM

## 2021-06-18 DIAGNOSIS — J9621 Acute and chronic respiratory failure with hypoxia: Secondary | ICD-10-CM | POA: Diagnosis not present

## 2021-06-18 DIAGNOSIS — K449 Diaphragmatic hernia without obstruction or gangrene: Secondary | ICD-10-CM | POA: Diagnosis not present

## 2021-06-18 DIAGNOSIS — R0602 Shortness of breath: Secondary | ICD-10-CM | POA: Diagnosis not present

## 2021-06-18 DIAGNOSIS — I272 Pulmonary hypertension, unspecified: Secondary | ICD-10-CM

## 2021-06-18 HISTORY — PX: RIGHT HEART CATH: CATH118263

## 2021-06-18 LAB — POCT I-STAT 7, (LYTES, BLD GAS, ICA,H+H)
Acid-Base Excess: 0 mmol/L (ref 0.0–2.0)
Acid-Base Excess: 0 mmol/L (ref 0.0–2.0)
Bicarbonate: 25.9 mmol/L (ref 20.0–28.0)
Bicarbonate: 26.4 mmol/L (ref 20.0–28.0)
Calcium, Ion: 1.22 mmol/L (ref 1.15–1.40)
Calcium, Ion: 1.22 mmol/L (ref 1.15–1.40)
HCT: 44 % (ref 36.0–46.0)
HCT: 45 % (ref 36.0–46.0)
Hemoglobin: 15 g/dL (ref 12.0–15.0)
Hemoglobin: 15.3 g/dL — ABNORMAL HIGH (ref 12.0–15.0)
O2 Saturation: 63 %
O2 Saturation: 64 %
Potassium: 3.7 mmol/L (ref 3.5–5.1)
Potassium: 3.8 mmol/L (ref 3.5–5.1)
Sodium: 142 mmol/L (ref 135–145)
Sodium: 143 mmol/L (ref 135–145)
TCO2: 27 mmol/L (ref 22–32)
TCO2: 28 mmol/L (ref 22–32)
pCO2 arterial: 46.5 mmHg (ref 32.0–48.0)
pCO2 arterial: 47.4 mmHg (ref 32.0–48.0)
pH, Arterial: 7.353 (ref 7.350–7.450)
pH, Arterial: 7.353 (ref 7.350–7.450)
pO2, Arterial: 35 mmHg — CL (ref 83.0–108.0)
pO2, Arterial: 35 mmHg — CL (ref 83.0–108.0)

## 2021-06-18 LAB — RESP PANEL BY RT-PCR (FLU A&B, COVID) ARPGX2
Influenza A by PCR: NEGATIVE
Influenza B by PCR: NEGATIVE
SARS Coronavirus 2 by RT PCR: NEGATIVE

## 2021-06-18 SURGERY — RIGHT HEART CATH

## 2021-06-18 MED ORDER — SODIUM CHLORIDE 0.9 % IV SOLN: 250.0000 mL | INTRAVENOUS | Status: DC | PRN

## 2021-06-18 MED ORDER — IOHEXOL 350 MG/ML SOLN
60.0000 mL | Freq: Once | INTRAVENOUS | Status: AC | PRN
  Administered 2021-06-18: 60 mL via INTRAVENOUS

## 2021-06-18 MED ORDER — HYDRALAZINE HCL 20 MG/ML IJ SOLN: 10.0000 mg | INTRAMUSCULAR | Status: DC | PRN

## 2021-06-18 MED ORDER — ASPIRIN 81 MG PO CHEW: 81.0000 mg | CHEWABLE_TABLET | ORAL | Status: DC

## 2021-06-18 MED ORDER — HEPARIN (PORCINE) IN NACL 1000-0.9 UT/500ML-% IV SOLN
INTRAVENOUS | Status: AC
  Filled 2021-06-18: qty 1000

## 2021-06-18 MED ORDER — SODIUM CHLORIDE 0.9 % IV SOLN: INTRAVENOUS | Status: DC

## 2021-06-18 MED ORDER — LIDOCAINE HCL (PF) 1 % IJ SOLN
INTRAMUSCULAR | Status: AC
  Filled 2021-06-18: qty 30

## 2021-06-18 MED ORDER — LIDOCAINE HCL (PF) 1 % IJ SOLN
INTRAMUSCULAR | Status: DC | PRN
  Administered 2021-06-18: 2 mL

## 2021-06-18 MED ORDER — SODIUM CHLORIDE 0.9 % IV SOLN
INTRAVENOUS | Status: AC | PRN
  Administered 2021-06-18: 10 mL/h via INTRAVENOUS

## 2021-06-18 MED ORDER — SODIUM CHLORIDE 0.9% FLUSH: 3.0000 mL | Freq: Two times a day (BID) | INTRAVENOUS | Status: DC

## 2021-06-18 MED ORDER — ACETAMINOPHEN 325 MG PO TABS: 650.0000 mg | ORAL_TABLET | ORAL | Status: DC | PRN

## 2021-06-18 MED ORDER — HEPARIN (PORCINE) IN NACL 1000-0.9 UT/500ML-% IV SOLN
INTRAVENOUS | Status: DC | PRN
  Administered 2021-06-18: 500 mL

## 2021-06-18 MED ORDER — VERAPAMIL HCL 2.5 MG/ML IV SOLN
INTRAVENOUS | Status: AC
  Filled 2021-06-18: qty 2

## 2021-06-18 MED ORDER — LABETALOL HCL 5 MG/ML IV SOLN: 10.0000 mg | INTRAVENOUS | Status: DC | PRN

## 2021-06-18 MED ORDER — ONDANSETRON HCL 4 MG/2ML IJ SOLN: 4.0000 mg | Freq: Four times a day (QID) | INTRAMUSCULAR | Status: DC | PRN

## 2021-06-18 MED ORDER — SODIUM CHLORIDE 0.9% FLUSH: 3.0000 mL | INTRAVENOUS | Status: DC | PRN

## 2021-06-18 SURGICAL SUPPLY — 7 items
CATH BALLN WEDGE 5F 110CM (CATHETERS) ×2 IMPLANT
GUIDEWIRE .025 260CM (WIRE) ×2 IMPLANT
PACK CARDIAC CATHETERIZATION (CUSTOM PROCEDURE TRAY) ×2 IMPLANT
SHEATH GLIDE SLENDER 4/5FR (SHEATH) ×2 IMPLANT
TRANSDUCER W/STOPCOCK (MISCELLANEOUS) ×2 IMPLANT
TUBING ART PRESS 72  MALE/FEM (TUBING) ×1
TUBING ART PRESS 72 MALE/FEM (TUBING) ×1 IMPLANT

## 2021-06-18 NOTE — ED Provider Notes (Signed)
[facility] EMERGENCY DEPARTMENT Provider Note  CSN: [id number] Arrival date & time: 06/17/21 1444  Chief Complaint(s) Shortness of Breath, Abdominal Pain, and Nausea  HPI Melissa Barron is a 87 y.o. female with a past medical history listed below including diastolic heart failure, emphysema, COPD on baseline 3 L nasal cannula at home who presents to the emergency department, referred by Dr. Virgina Jock from clinic for admission due to acute on chronic respiratory failure.  Patient was noted to be hypoxic with sats in the 80s in the office on her regular 3 L.  She was increased to 4 L.  There was question of whether this is diastolic heart failure exacerbation versus COPD.  Patient denies any recent fevers or infections.  No coughing or congestion.  No lower extremity edema.  HPI  Past Medical History Past Medical History:  Diagnosis Date   Coronary artery disease    Emphysema, unspecified ([place])    Both   Heart attack ([place]) 2016   Hyperlipidemia    Hypertension    Patient Active Problem List   Diagnosis Date Noted   Acute on chronic respiratory failure with hypoxia ([place]) 06/17/2021   Chronic obstructive pulmonary disease ([place]) 06/17/2021   Chronic heart failure with preserved ejection fraction ([place]) 03/13/2021   Unstable angina ([place]) 12/27/2020   Exertional dyspnea 12/26/2020   Nonrheumatic aortic valve stenosis 12/26/2020   Polycythemia vera ([place]) 07/06/2020   Coronary artery disease 06/18/2020   Bruit of right carotid artery 06/18/2020   Family history of abdominal aortic aneurysm (AAA) 06/18/2020   Essential hypertension 06/18/2020   Home Medication(s) Prior to Admission medications   Medication Sig Start Date End Date Taking? Authorizing Provider  amLODipine (NORVASC) 10 MG tablet Take 1 tablet (10 mg total) by mouth daily. 03/12/21 03/12/22 Yes Patwardhan, Reynold Bowen, MD  aspirin EC 81 MG tablet Take 1 tablet (81 mg total) by mouth daily. Swallow whole.  06/18/20  Yes Patwardhan, Manish J, MD  atorvastatin (LIPITOR) 80 MG tablet Take 80 mg by mouth daily. 05/29/20  Yes [provider]  bisoprolol (ZEBETA) 5 MG tablet Take 1 tablet (5 mg total) by mouth daily. 03/12/21  Yes Patwardhan, Manish J, MD  Cholecalciferol (VITAMIN D3) 250 MCG (10000 UT) capsule Take 1 capsule (10,000 Units total) by mouth daily. 07/06/20  Yes Nicholas Lose, MD  clopidogrel (PLAVIX) 75 MG tablet TAKE 1 TABLET EVERY DAY Patient taking differently: Take 75 mg by mouth daily. 05/28/21  Yes Patwardhan, Reynold Bowen, MD  denosumab (PROLIA) 60 MG/ML SOSY injection Inject 60 mg into the skin every 6 (six) months.   Yes [provider]  furosemide (LASIX) 20 MG tablet Take 1 tablet (20 mg total) by mouth daily. 03/19/21  Yes Patwardhan, Manish J, MD  hydroxyurea (HYDREA) 500 MG capsule Take 1 capsule (500 mg total) by mouth daily. Mon through Friday. Off on weekends Patient taking differently: Take 500 mg by mouth See admin instructions. Take 551m daily on Monday through Friday only. Off on weekends 01/03/21  Yes GNicholas Lose MD  irbesartan (AVAPRO) 300 MG tablet Take 1 tablet (300 mg total) by mouth at bedtime. 06/18/20  Yes Patwardhan, Manish J, MD  isosorbide mononitrate (IMDUR) 30 MG 24 hr tablet TAKE 1 TABLET (30 MG TOTAL) BY MOUTH DAILY. 05/28/21  Yes Patwardhan, Manish J, MD  KLOR-CON M20 20 MEQ tablet TAKE 1 TABLET BY MOUTH EVERY DAY Patient taking differently: Take 20 mEq by mouth daily. 05/16/21  Yes Patwardhan, MReynold Bowen MD  nitroGLYCERIN (NITROSTAT) 0.4 MG SL tablet Place 1 tablet (0.4 mg total) under the tongue every 5 (five) minutes x 3 doses as needed for chest pain. 12/29/20  Yes Adrian Prows, MD  nystatin ointment (MYCOSTATIN) Apply 1 application topically 2 (two) times daily as needed (rash). 04/29/21  Yes [provider]  pantoprazole (PROTONIX) 40 MG tablet Take 1 tablet (40 mg total) by mouth daily. 06/18/20  Yes Patwardhan, Manish J, MD  sertraline  (ZOLOFT) 100 MG tablet Take 100 mg by mouth daily. 04/24/20  Yes [provider]  Tiotropium Bromide-Olodaterol (STIOLTO RESPIMAT) 2.5-2.5 MCG/ACT AERS Inhale 2 puffs into the lungs daily. 02/21/21  Yes Hunsucker, Bonna Gains, MD  furosemide (LASIX) 40 MG tablet TAKE 1 TABLET BY MOUTH EVERY DAY Patient not taking: Reported on 06/18/2021 05/16/21   Nigel Mormon, MD                                                                                                                                    Past Surgical History Past Surgical History:  Procedure Laterality Date   CARDIAC CATHETERIZATION     CORONARY ATHERECTOMY N/A 12/28/2020   Procedure: CORONARY ATHERECTOMY;  Surgeon: Nigel Mormon, MD;  Location: [facility];  Service: Cardiovascular;  Laterality: N/A;   INTRAVASCULAR PRESSURE WIRE/FFR STUDY N/A 12/28/2020   Procedure: INTRAVASCULAR PRESSURE WIRE/FFR STUDY;  Surgeon: Nigel Mormon, MD;  Location: [facility];  Service: Cardiovascular;  Laterality: N/A;   LEFT HEART CATH AND CORONARY ANGIOGRAPHY N/A 12/28/2020   Procedure: LEFT HEART CATH AND CORONARY ANGIOGRAPHY;  Surgeon: Nigel Mormon, MD;  Location: [facility];  Service: Cardiovascular;  Laterality: N/A;   TOTAL ABDOMINAL HYSTERECTOMY W/ BILATERAL SALPINGOOPHORECTOMY  1984   Family History Family History  Problem Relation Age of Onset   Heart attack Mother    Colon cancer Brother    Parkinson's disease Sister    Emphysema Sister     Social History Social History   Tobacco Use   Smoking status: Former    Packs/day: 1.50    Years: 30.00    Pack years: 45.00    Types: Cigarettes    Quit date: 1980    Years since quitting: 42.8   Smokeless tobacco: Never  Vaping Use   Vaping Use: Never used  Substance Use Topics   Alcohol use: Yes    Alcohol/week: 1.0 standard drink    Types: 1 Glasses of wine per week    Comment: 7 days a week/1 a day   Drug use: Never    Allergies Latex  Review of Systems Review of Systems All other systems are reviewed and are negative for acute change except as noted in the HPI  Physical Exam Vital Signs  I have reviewed the triage vital signs BP (!) 118/56   Pulse 60   Temp 98.9 F (37.2 C)   Resp 17  Ht _0  (1.499 m)   Wt 50.3 kg   SpO2 98%   BMI 22.42 kg/m   Physical Exam Vitals reviewed.  Constitutional:      General: She is not in acute distress.    Appearance: She is well-developed. She is not diaphoretic.  HENT:     Head: Normocephalic and atraumatic.     Nose: Nose normal.  Eyes:     General: No scleral icterus.       Right eye: No discharge.        Left eye: No discharge.     Conjunctiva/sclera: Conjunctivae normal.     Pupils: Pupils are equal, round, and reactive to light.  Cardiovascular:     Rate and Rhythm: Normal rate and regular rhythm.     Heart sounds: No murmur heard.   No friction rub. No gallop.  Pulmonary:     Effort: Pulmonary effort is normal. No respiratory distress.     Breath sounds: No stridor. Examination of the right-middle field reveals rales. Examination of the left-middle field reveals rales. Examination of the right-lower field reveals rales. Examination of the left-lower field reveals rales. Rales (fine) present.  Abdominal:     General: There is no distension.     Palpations: Abdomen is soft.     Tenderness: There is no abdominal tenderness.  Musculoskeletal:        General: No tenderness.     Cervical back: Normal range of motion and neck supple.  Skin:    General: Skin is warm and dry.     Findings: No erythema or rash.  Neurological:     Mental Status: She is alert and oriented to person, place, and time.    ED Results and Treatments Labs (all labs ordered are listed, but only abnormal results are displayed) Labs Reviewed  CBC - Abnormal; Notable for the following components:      Result Value   WBC 14.3 (*)    RBC 5.40 (*)    Hemoglobin  15.3 (*)    HCT 48.1 (*)    RDW 15.8 (*)    Platelets 639 (*)    All other components within normal limits  BRAIN NATRIURETIC PEPTIDE - Abnormal; Notable for the following components:   B Natriuretic Peptide 2,135.2 (*)    All other components within normal limits  COMPREHENSIVE METABOLIC PANEL - Abnormal; Notable for the following components:   Creatinine, Ser 1.08 (*)    Calcium 8.6 (*)    Total Protein 5.9 (*)    Albumin 3.0 (*)    GFR, Estimated 49 (*)    All other components within normal limits  TROPONIN I (HIGH SENSITIVITY) - Abnormal; Notable for the following components:   Troponin I (High Sensitivity) 40 (*)    All other components within normal limits  TROPONIN I (HIGH SENSITIVITY) - Abnormal; Notable for the following components:   Troponin I (High Sensitivity) 59 (*)    All other components within normal limits  RESP PANEL BY RT-PCR (FLU A&B, COVID) ARPGX2  LIPASE, BLOOD  EKG  EKG Interpretation  Date/Time:  Monday June 17 2021 15:15:21 EDT Ventricular Rate:  72 PR Interval:  214 QRS Duration: 90 QT Interval:  428 QTC Calculation: 468 R Axis:   140 Text Interpretation: Sinus rhythm with 1st degree A-V block Right axis deviation T wave abnormality still present but improved from prior Abnormal ECG Confirmed by Addison Lank [phone]) on 06/18/2021 4:47:33 AM       Radiology DG Chest 2 View  Result Date: 06/17/2021 CLINICAL DATA:  Heart failure. Worsening shortness of breath for 3 weeks. EXAM: CHEST - 2 VIEW COMPARISON:  02/14/2021 FINDINGS: The cardiac silhouette remains mildly enlarged. Aortic atherosclerosis is noted. The lungs remain hyperinflated with similar appearance of widespread interstitial densities bilaterally including some asymmetric right upper lobe involvement. No superimposed acute airspace consolidation, pleural effusion,  pneumothorax is identified. No acute osseous abnormality is seen. IMPRESSION: Similar appearance of chronic interstitial lung disease. No definite acute finding. Electronically Signed   By: Logan Bores M.D.   On: 06/17/2021 16:47    Pertinent labs & imaging results that were available during my care of the patient were reviewed by me and considered in my medical decision making (see MDM for details).  Medications Ordered in ED Medications  aspirin chewable tablet 81 mg (has no administration in time range)  0.9 %  sodium chloride infusion (has no administration in time range)  iohexol (OMNIPAQUE) 350 MG/ML injection 60 mL (60 mLs Intravenous Contrast Given 06/18/21 0612)                                                                                                                                     Procedures Procedures  (including critical care time)  Medical Decision Making / ED Course I have reviewed the nursing notes for this encounter and the patient's prior records (if available in EHR or on provided paperwork).  Yatzari Jonsson was evaluated in Emergency Department on 06/18/2021 for the symptoms described in the history of present illness. She was evaluated in the context of the global COVID-19 pandemic, which necessitated consideration that the patient might be at risk for infection with the SARS-CoV-2 virus that causes COVID-19. Institutional protocols and algorithms that pertain to the evaluation of patients at risk for COVID-19 are in a state of rapid change based on information released by regulatory bodies including the CDC and federal and state organizations. These policies and algorithms were followed during the patient's care in the ED.     Acute on chronic respiratory failure.  Likely multifactorial.  With likely superimposed diastolic heart failure and COPD exacerbation.  Patient reports a history of prior PEs. Chest x-ray without evidence of pneumonia.  Stable interstitial  lung disease noted.  Will obtain a CT PE study  Pertinent labs & imaging results that were available during my care of the patient were reviewed by me and considered in my medical decision making:  Admit  to medicine. Dr. Lora Havens to follow for right heart cath.   Final Clinical Impression(s) / ED Diagnoses Final diagnoses:  Acute on chronic respiratory failure with hypoxia ([place])     This chart was dictated using voice recognition software.  Despite best efforts to proofread,  errors can occur which can change the documentation meaning.    Fatima Blank, MD 06/18/21 [phone]

## 2021-06-18 NOTE — Interval H&P Note (Signed)
History and Physical Interval Note:  06/18/2021 7:37 AM  Melissa Barron  has presented today for surgery, with the diagnosis of heart failure, CAD.  The various methods of treatment have been discussed with the patient and family. After consideration of risks, benefits and other options for treatment, the patient has consented to  Procedure(s): RIGHT HEART CATH (N/A) as a surgical intervention.  The patient's history has been reviewed, patient examined, no change in status, stable for surgery.  I have reviewed the patient's chart and labs.  Questions were answered to the patient's satisfaction.    2012 Appropriate Use Criteria for Diagnostic Catheterization Cardiomyopathies (Right and Left Heart Catheterization OR Right Heart Catheterization Alone With/Without Left Ventriculography and Coronary Angiography) Indication:  Known or suspected cardiomyopathy with or without heart failure A (7) Indication: 93; Score 7   Anthoni Geerts J Torian Quintero

## 2021-06-18 NOTE — H&P (Signed)
OV 06/17/2021 copied for documentation     Patient referred by [name], MD for coronary artery disease  Subjective:   Melissa Barron, female    DOB: 13-Aug-1931, 87 y.o.   MRN: [id number]   Chief Complaint  Patient presents with   Coronary artery disease of native artery of native heart wi   Hypertension   Follow-up    2-3 weeks    87 y.o. Caucasian female with hypertension, hyperlipidemia, CAD, HFpEF, COPD, depression, GERD  Patient is here with her son today.  For the past 3 weeks, she has had progressive worsening exertional dyspnea.  She is now on 24/7 3-4 L supplemental oxygen.  She is unable to do hardly anything due to severity of her dyspnea.  She has had occasional chest pain that did improve with nitroglycerin.  However, recent stress test did not show any significant ischemia.  Reviewed recent lab results with the patient, details below.  In the office today, her oxygen saturation was down to low 80s on 3 L supplemental oxygen, which had to be increased to 4 L.   Current Outpatient Medications on File Prior to Visit  Medication Sig Dispense Refill   amLODipine (NORVASC) 10 MG tablet Take 1 tablet (10 mg total) by mouth daily. 90 tablet 3   aspirin EC 81 MG tablet Take 1 tablet (81 mg total) by mouth daily. Swallow whole. 90 tablet 3   atorvastatin (LIPITOR) 80 MG tablet Take 80 mg by mouth daily.     bisoprolol (ZEBETA) 5 MG tablet Take 1 tablet (5 mg total) by mouth daily. 90 tablet 3   Cholecalciferol (VITAMIN D3) 250 MCG (10000 UT) capsule Take 1 capsule (10,000 Units total) by mouth daily.     clopidogrel (PLAVIX) 75 MG tablet TAKE 1 TABLET EVERY DAY 180 tablet 1   denosumab (PROLIA) 60 MG/ML SOSY injection Inject 60 mg into the skin every 6 (six) months.     furosemide (LASIX) 20 MG tablet Take 1 tablet (20 mg total) by mouth daily. 90 tablet 1   furosemide (LASIX) 40 MG tablet TAKE 1 TABLET BY MOUTH EVERY DAY 90 tablet 1   hydroxyurea (HYDREA) 500 MG capsule  Take 1 capsule (500 mg total) by mouth daily. Mon through Friday. Off on weekends (Patient taking differently: Take 500 mg by mouth See admin instructions. Take 564m daily on Monday through Friday only. Off on weekends) 90 capsule 3   irbesartan (AVAPRO) 300 MG tablet Take 1 tablet (300 mg total) by mouth at bedtime. 90 tablet 3   isosorbide mononitrate (IMDUR) 30 MG 24 hr tablet TAKE 1 TABLET (30 MG TOTAL) BY MOUTH DAILY. 90 tablet 1   KLOR-CON M20 20 MEQ tablet TAKE 1 TABLET BY MOUTH EVERY DAY 90 tablet 1   nitroGLYCERIN (NITROSTAT) 0.4 MG SL tablet Place 1 tablet (0.4 mg total) under the tongue every 5 (five) minutes x 3 doses as needed for chest pain. 25 tablet 3   pantoprazole (PROTONIX) 40 MG tablet Take 1 tablet (40 mg total) by mouth daily. 90 tablet 3   sertraline (ZOLOFT) 100 MG tablet Take 100 mg by mouth daily.     Tiotropium Bromide-Olodaterol (STIOLTO RESPIMAT) 2.5-2.5 MCG/ACT AERS Inhale 2 puffs into the lungs daily. 4 g 3   No current facility-administered medications on file prior to visit.    Cardiovascular and other pertinent studies:  Lexiscan (Walking with mod Bruce)Tetrofosmin Stress Test: 06/12/2021: 1 Day Rest/Stress Protocol. Stress EKG is non-diagnostic, as this is  pharmacological stress test using Lexiscan. Normal myocardial perfusion without convincing evidence of reversible myocardial ischemia or prior infarct. Left ventricular wall thickness preserved, no regional wall motion abnormalities, calculated LVEF 79%. Low risk study. No prior studies for comparison.  EKG 05/24/2021: Sinus rhythm 56 bpm First degree A-V block  Anteroseptal infarct -age undetermined Nonspecific T wave inversion lead III  Echocardiogram 01/01/2021: Left ventricle cavity is normal in size. Moderate concentric hypertrophy of the left ventricle. Normal global wall motion. Normal LV systolic function with EF 63%. Doppler evidence of grade I (impaired) diastolic dysfunction, normal LAP.   Left atrial cavity is moderately dilated. Trileaflet aortic valve with moderate calcification. Transvalvular mean PG 28 mmHg, max PG 44 mmHg, likely originating predominantly from LVOT than true valvular stenosis.  Moderate (grade II) aortic regurgitation. Mild tricuspid regurgitation. Estimated pulmonary artery systolic pressure 37 mmHg.  No significant change compared to previous study on 06/26/2020.   Coronary intervention 12/28/2020: LM: Distal 20% stenosis LAD: Tortuous vessel        Prox-mid prior stent with focal 80% ISR        Attempted orbital atherectomy        Shockwave lithotripsy        PTCA and stent placement 3.5 X 12 mm Synergy drug-eluting stent        Intravascular ultrasound (IVUS) MSA 7.1 mm2        80%-->0% residual stenosis        TIMI flow II-->III LCx: Tortuous vessel          Mid focal 40% stenosis RCA: Prox-mid prior stent         Mid 40% ISR         RV marginal branch with prox 90% diffuse disease   LV-Ao Peak-to-peak gradient 21 mmHg LV-Ao Mean PG 20 mmHg Moderate aortic stenosis   Carotid artery duplex 06/26/2020:  Doppler velocity suggests stenosis in the right internal carotid artery  (16-49%). Stenosis in the right external carotid artery (<50%).  Doppler velocity suggests stenosis in the left internal carotid artery  (50-69%). Stenosis in the left external carotid artery (<50%).  Antegrade right vertebral artery flow. Antegrade left vertebral artery  flow.  Follow up in six months is appropriate if clinically indicated.  Recent labs: 06/10/2021: Glucose 91, BUN/Cr 19/1.01. EGFR 54. Na/K 142/4.5.  BNP 849 H/H 16/49. MCV 88. Platelets 631  02/14/2021: BNP 790 high  01/03/2021: H/H 13.7/42.4. MCV 103.9. Platelets 254  05/12/2020: Glucose 80, BUN/Cr 17/0.7. EGFR 79. Na/K 137/4.1. Rest of the CMP normal H/H 14/44. MCV 106. Platelets 280 HbA1C N/A Chol 124, TG 106, HDL 42, LDL 61 TSH N/A    Review of Systems  Cardiovascular:  Positive  for chest pain and dyspnea on exertion (Worsening). Negative for leg swelling, palpitations and syncope.  Respiratory:  Positive for shortness of breath (Worsening).         Vitals:   06/17/21 1150  BP: 127/63  Pulse: 73  Resp: 16  Temp: 97.8 F (36.6 C)  SpO2: 93%    Body mass index is 22.62 kg/m. Filed Weights   06/17/21 1150  Weight: 112 lb (50.8 kg)      Objective:   Physical Exam Vitals and nursing note reviewed.  Constitutional:      General: She is not in acute distress.    Appearance: She is underweight.  Neck:     Vascular: No JVD.  Cardiovascular:     Rate and Rhythm: Normal rate and regular rhythm.  Pulses:          Carotid pulses are  on the right side with bruit.    Heart sounds: Murmur heard.  Harsh midsystolic murmur is present with a grade of 3/6 at the upper right sternal border radiating to the neck.  Pulmonary:     Effort: Pulmonary effort is normal.     Breath sounds: Examination of the right-lower field reveals rales. Examination of the left-lower field reveals rales. Rales present. No wheezing.  Musculoskeletal:     Right lower leg: No edema.     Left lower leg: No edema.        Assessment & Recommendations:   87 y.o. Caucasian female with hypertension, hyperlipidemia, CAD, HFpEF, COPD, depression, GERD  Acute on chronic hypoxic respiratory failure: Worsening oxygen requirement with worsening dyspnea symptoms. She does have bilateral rails on exam, she has no JVD or leg edema.  Her volume status is unclear on physical exam alone.  Recent BNP was elevated.  However, patient also has underlying COPD.  I suspect her hypoxia is multifactorial.  She will benefit from multidisciplinary work-up and management, which can best be performed in an inpatient setting.  Currently, there are no hospital beds available.  I encouraged her to go to [facility] emergency room with potential admission to medicine service for acute on chronic hypoxic  respiratory failure.  From cardiac standpoint, I recommend right heart catheterization to assess her volume status, and tailor diuretic therapy as necessary.  I will also obtain an echocardiogram to re- assess her systolic and diastolic function, as well as aortic valve function.  Clinically, I do not think aortic stenosis is anything worse than moderate and unlikely to explain her symptoms by itself.  From pulmonary standpoint, could consider CT chest to evaluate lung parenchyma.  Defer rest of the work-up to primary/pulmonary team.  Should the patient get admitted to hospital, I will continue to follow along.  I have tentatively scheduled her for right heart catheterization 06/18/2021 morning.  CAD: History of LAD restenosis, last PCI 11/2020. Occasional angina episodes without any ischemia on stress testing 06/2021. Continue DAPT, beta-blocker, statin.  Hypertension: Controlled.   F/u in 3 weeks   Nigel Mormon, MD Pager: [phone] Office: [phone]

## 2021-06-19 ENCOUNTER — Ambulatory Visit: Payer: Medicare HMO | Admitting: Cardiology

## 2021-06-19 ENCOUNTER — Other Ambulatory Visit: Payer: Self-pay

## 2021-06-19 ENCOUNTER — Encounter: Payer: Self-pay | Admitting: Cardiology

## 2021-06-19 ENCOUNTER — Ambulatory Visit (HOSPITAL_COMMUNITY): Payer: Medicare HMO

## 2021-06-19 ENCOUNTER — Ambulatory Visit: Payer: Medicare HMO

## 2021-06-19 VITALS — BP 122/54 | HR 70 | Ht 59.0 in | Wt 111.0 lb

## 2021-06-19 DIAGNOSIS — J9621 Acute and chronic respiratory failure with hypoxia: Secondary | ICD-10-CM | POA: Diagnosis not present

## 2021-06-19 DIAGNOSIS — I25118 Atherosclerotic heart disease of native coronary artery with other forms of angina pectoris: Secondary | ICD-10-CM

## 2021-06-19 DIAGNOSIS — I2721 Secondary pulmonary arterial hypertension: Secondary | ICD-10-CM | POA: Diagnosis not present

## 2021-06-19 DIAGNOSIS — I10 Essential (primary) hypertension: Secondary | ICD-10-CM

## 2021-06-19 NOTE — Progress Notes (Signed)
Patient referred by [name], MD for coronary artery disease  Subjective:   Melissa Barron, female    DOB: 26-Jun-1931, 87 y.o.   MRN: [id number]   Chief Complaint  Patient presents with   Coronary artery disease of native artery of native heart wi   Hypertension   Acute on chronic respiratory failure with hypoxia    Follow-up    87 y.o. Caucasian female with hypertension, hyperlipidemia, CAD, COPD, depression, GERD, now with acute worsening of exertional dyspnea.  I saw the patient on 06/17/2021 with complaints of acute worsening of her exertional dyspnea over the past few weeks.  I sent her to ER where work-up showed elevated BNP and mildly elevated troponin.  CTA ruled out PE, showed unchanged emphysema and mild chronic interstitial changes.  Right heart catheterization showed normal wedge pressures, but with mean PA pressure of 42 mmHg and PVR of 12 Wood units.  Findings were thought to be consistent with predominantly Group 1 pulmonary hypertension.  Patient is here for follow-up today with her son.  Clinically, she remains oxygen dependent and asymptomatic with her day-to-day activity.  She denies any productive cough, wheezing.  She has had occasional chest pain episode lasting for a minute or two.  Overall, she remains very limited with her physical capacity and performing ADLs.  6-minute walk test and echocardiogram reviewed with the patient, details below.   Six Minute Walk - 06/19/21 1143       Six Minute Walk   Supplemental oxygen during test? Yes   4 L   O2 Flow Rate (L/min) 4 L/min    Type Continuous    Lap distance in meters  20 meters    Laps Completed  8    Baseline BP (sitting) 122/54    Baseline Heartrate 67    Baseline SPO2 95 %      Interval Oxygen Saturation and HR    2 Minute Oxygen Saturation % 76 %    2 Minute HR 89    4 Minute Oxygen Saturation % 76 %    4 Minute HR 89    6 Minute Oxygen Saturation % 74 %    6 Minute HR 82      2 Minutes Post  Walk Values   Stopped or paused before six minutes? Yes    Reason: lighheaded stoped for 5 min    Other Symptoms at end of exercise: Leg pain   lightheaded              Current Outpatient Medications on File Prior to Visit  Medication Sig Dispense Refill   amLODipine (NORVASC) 10 MG tablet Take 1 tablet (10 mg total) by mouth daily. 90 tablet 3   aspirin EC 81 MG tablet Take 1 tablet (81 mg total) by mouth daily. Swallow whole. 90 tablet 3   atorvastatin (LIPITOR) 80 MG tablet Take 80 mg by mouth daily.     bisoprolol (ZEBETA) 5 MG tablet Take 1 tablet (5 mg total) by mouth daily. 90 tablet 3   Cholecalciferol (VITAMIN D3) 250 MCG (10000 UT) capsule Take 1 capsule (10,000 Units total) by mouth daily.     clopidogrel (PLAVIX) 75 MG tablet TAKE 1 TABLET EVERY DAY (Patient taking differently: Take 75 mg by mouth daily.) 180 tablet 1   denosumab (PROLIA) 60 MG/ML SOSY injection Inject 60 mg into the skin every 6 (six) months.     furosemide (LASIX) 20 MG tablet Take 1 tablet (20 mg total)  by mouth daily. 90 tablet 1   furosemide (LASIX) 40 MG tablet TAKE 1 TABLET BY MOUTH EVERY DAY 90 tablet 1   hydroxyurea (HYDREA) 500 MG capsule Take 1 capsule (500 mg total) by mouth daily. Mon through Friday. Off on weekends (Patient taking differently: Take 500 mg by mouth See admin instructions. Take 553m daily on Monday through Friday only. Off on weekends) 90 capsule 3   irbesartan (AVAPRO) 300 MG tablet Take 1 tablet (300 mg total) by mouth at bedtime. 90 tablet 3   isosorbide mononitrate (IMDUR) 30 MG 24 hr tablet TAKE 1 TABLET (30 MG TOTAL) BY MOUTH DAILY. 90 tablet 1   KLOR-CON M20 20 MEQ tablet TAKE 1 TABLET BY MOUTH EVERY DAY (Patient taking differently: Take 20 mEq by mouth daily.) 90 tablet 1   nitroGLYCERIN (NITROSTAT) 0.4 MG SL tablet Place 1 tablet (0.4 mg total) under the tongue every 5 (five) minutes x 3 doses as needed for chest pain. 25 tablet 3   nystatin ointment (MYCOSTATIN) Apply 1  application topically 2 (two) times daily as needed (rash).     pantoprazole (PROTONIX) 40 MG tablet Take 1 tablet (40 mg total) by mouth daily. 90 tablet 3   sertraline (ZOLOFT) 100 MG tablet Take 100 mg by mouth daily.     Tiotropium Bromide-Olodaterol (STIOLTO RESPIMAT) 2.5-2.5 MCG/ACT AERS Inhale 2 puffs into the lungs daily. 4 g 3   No current facility-administered medications on file prior to visit.    Cardiovascular and other pertinent studies:  Echocardiogram 06/19/2021:  Left ventricle cavity is normal in size. Mild concentric hypertrophy of  the left ventricle. Systolic flattening of interventricular septum  suggests RV pressure overload.  Normal LV systolic function with visual EF  65-70%. Doppler evidence of grade I (impaired) diastolic dysfunction,  normal LAP.  Left atrial cavity is moderately dilated. The interatrial Septum is thin  and mobile but appears to be intact by 2D and CF Doppler interrogation.  Septum bulges towards left atrium suggesting RA pressure overload.  Right atrial cavity is moderately dilated.  Right ventricle cavity is moderately dilated. Normal right ventricular  function.  Trileaflet aortic valve with mild aortic valve leaflet calcification.  Moderate aortic stenosis. Vmax 3.5 m/sec, mean PG 27 mmHg, AVA 1.2 cm by  continuity equation. Moderate (Grade II) aortic regurgitation.  Mild mitral valve leaflet calcification. Mild restriction of leaflets  without significant stenosis. Mild (Grade I) mitral regurgitation.  Moderate to severe tricuspid regurgitation. At least moderate pulmonary  hypertension. Estimated pulmonary artery systolic pressure at least 57  mmHg.  Mild pulmonic regurgitation.  IVC is dilated with a respiratory response of <50%. Estimated right atrial  pressure at least 15 mmHg.  Compared to previous study on 01/01/2021, tricuspid regurgitation has  increased from mild to mod-severe, estimated PASP increased from 37 mmHg  to at  least 57 mmHg, RA/RV dilatation is new. Aortic valve stenosis  (previously reported as LVOT obstruction) is unchanged.    [place] RA: 4 mmHg RV: 76/2 mmHg PA: 75/23 mmHg, mPAP 42 mmHg PCW: 6-7 mmHg (PCW was checked and confirmed in multiple locations-RU,RL,LU)   CO: 2.9 L/min CI: 2.0 L/min/m2   PVR: 12 WU   Moderate pulmonary hypertension, WHO Grp I    Lexiscan (Walking with mod Bruce)Tetrofosmin Stress Test: 06/12/2021: 1 Day Rest/Stress Protocol. Stress EKG is non-diagnostic, as this is pharmacological stress test using Lexiscan. Normal myocardial perfusion without convincing evidence of reversible myocardial ischemia or prior infarct. Left ventricular wall thickness  preserved, no regional wall motion abnormalities, calculated LVEF 79%. Low risk study. No prior studies for comparison.  EKG 05/24/2021: Sinus rhythm 56 bpm First degree A-V block  Anteroseptal infarct -age undetermined Nonspecific T wave inversion lead III  Echocardiogram 01/01/2021: Left ventricle cavity is normal in size. Moderate concentric hypertrophy of the left ventricle. Normal global wall motion. Normal LV systolic function with EF 63%. Doppler evidence of grade I (impaired) diastolic dysfunction, normal LAP.  Left atrial cavity is moderately dilated. Trileaflet aortic valve with moderate calcification. Transvalvular mean PG 28 mmHg, max PG 44 mmHg, likely originating predominantly from LVOT than true valvular stenosis.  Moderate (grade II) aortic regurgitation. Mild tricuspid regurgitation. Estimated pulmonary artery systolic pressure 37 mmHg.  No significant change compared to previous study on 06/26/2020.   Coronary intervention 12/28/2020: LM: Distal 20% stenosis LAD: Tortuous vessel        Prox-mid prior stent with focal 80% ISR        Attempted orbital atherectomy        Shockwave lithotripsy        PTCA and stent placement 3.5 X 12 mm Synergy drug-eluting stent        Intravascular  ultrasound (IVUS) MSA 7.1 mm2        80%-->0% residual stenosis        TIMI flow II-->III LCx: Tortuous vessel          Mid focal 40% stenosis RCA: Prox-mid prior stent         Mid 40% ISR         RV marginal branch with prox 90% diffuse disease   LV-Ao Peak-to-peak gradient 21 mmHg LV-Ao Mean PG 20 mmHg Moderate aortic stenosis   Carotid artery duplex 06/26/2020:  Doppler velocity suggests stenosis in the right internal carotid artery  (16-49%). Stenosis in the right external carotid artery (<50%).  Doppler velocity suggests stenosis in the left internal carotid artery  (50-69%). Stenosis in the left external carotid artery (<50%).  Antegrade right vertebral artery flow. Antegrade left vertebral artery  flow.  Follow up in six months is appropriate if clinically indicated.  Recent labs: 06/10/2021: Glucose 91, BUN/Cr 19/1.01. EGFR 54. Na/K 142/4.5.  BNP 849 H/H 16/49. MCV 88. Platelets 631  02/14/2021: BNP 790 high  01/03/2021: H/H 13.7/42.4. MCV 103.9. Platelets 254  05/12/2020: Glucose 80, BUN/Cr 17/0.7. EGFR 79. Na/K 137/4.1. Rest of the CMP normal H/H 14/44. MCV 106. Platelets 280 HbA1C N/A Chol 124, TG 106, HDL 42, LDL 61 TSH N/A    Review of Systems  Constitutional: Positive for malaise/fatigue.  Cardiovascular:  Positive for chest pain and dyspnea on exertion (Worsening). Negative for leg swelling, palpitations and syncope.  Respiratory:  Positive for shortness of breath (Worsening).         Vitals:   06/19/21 1127  BP: (!) 122/54  Pulse: 70  SpO2: 95%    Body mass index is 22.42 kg/m. Filed Weights   06/19/21 1127  Weight: 111 lb (50.3 kg)      Objective:   Physical Exam Vitals and nursing note reviewed.  Constitutional:      General: She is not in acute distress.    Appearance: She is underweight.     Comments: On supplemental oxygen  Neck:     Vascular: No JVD.  Cardiovascular:     Rate and Rhythm: Normal rate and regular rhythm.      Pulses:          Carotid pulses are  on the right side with bruit.    Heart sounds: Murmur heard.  Pulmonary:     Effort: Pulmonary effort is normal.     Breath sounds: Examination of the right-lower field reveals rales. Examination of the left-lower field reveals rales. Rales present. No wheezing.  Musculoskeletal:     Right lower leg: No edema.     Left lower leg: No edema.        Assessment & Recommendations:   87 y.o. Caucasian female with hypertension, hyperlipidemia, CAD, COPD, depression, GERD, now with acute worsening of exertional dyspnea, new diagnosis pulmonary hypertension  Pulmonary hypertension: Predominantly WHO group 1, FC III REVEAL 2.0 score 13  High risk  Patient has a rapid deterioration in her dyspnea symptoms over the last few weeks.  She does have coronary artery disease and previously thought to have had mild diastolic dysfunction, along with moderate aortic stenosis and aortic regurgitation.  She has also had longstanding COPD, but without any recent change in her CT findings.  Historically, her DLCO has remained around 40-50% range.  I reviewed her echocardiogram, 6-minute walk test, and right heart catheterization finding performed within the last 2 days.  Right heart catheterization showing mean PA pressure of 42 mmHg, with 12 Wood units and normal wedge pressure, is out of proportion to the amount of emphysema seen on CT chest.  There is no PE on CTA.  Echocardiogram today shows predominantly right-sided findings with moderate RA/RV dilatation, fortunately with preserved RV systolic function.  There is only mild left atrial dilatation, and unchanged moderate aortic stenosis and aortic regurgitation.  LV systolic function is intact with only grade 1 diastolic dysfunction.  Overall, my impression is that her rapid deterioration can only be explained by WHO group 1 pulmonary hypertension, without significant contribution to suggest WHO group 2 or group 3.  I  have discussed my thoughts with patient's pulmonologist Dr. Silas Flood, who agrees with above assessment.  I will obtain ESR, CRP, HIV, connective tissue disease screening, to rule out any alternate etiology for her pulmonary hypertension.  Given her high revealed 2.0 score, I offered at least dual therapy upfront.  Given her low cardiac output, I do not think pulmonary vasodilators or PDE 5 inhibitors will be appropriate.  There is also risk of worsening VQ mismatch with vasodilators, given her underlying COPD.  Therefore, I recommend the following dual therapy at this time.  We will start her on prostacyclin analog Tyvaso (treprostinil), and endothelin receptor antagonist Opsumit (macitentan).  Major side effects, including nausea, dizziness, headache, cough, leg edema, discussed with the patient in detail.  Given patient's prior coronary artery disease, and moderate valvular disease, made a special mention about making me aware of any acute worsening of dyspnea or leg edema symptoms.  Will start this therapy through special pharmacy.  We will have close monitoring for patient's side effects or any other symptoms.  We will repeat echocardiogram and consider right heart catheterization in 3 months.  At this time I have held her daily Lasix and only use it as needed.  I will see her back in 4 weeks.  CAD: History of LAD restenosis, last PCI 11/2020. Occasional angina episodes without any ischemia on stress testing 06/2021. Continue DAPT, beta-blocker, statin.  Aortic stenosis, aortic regurgitation: Moderate.  Continue serial echocardiogram monitoring  Hypertension: Controlled.   F/u in 4 weeks   Nigel Mormon, MD Pager: [phone] Office: [phone]

## 2021-06-20 ENCOUNTER — Other Ambulatory Visit: Payer: Self-pay | Admitting: Pharmacist

## 2021-06-20 DIAGNOSIS — I2721 Secondary pulmonary arterial hypertension: Secondary | ICD-10-CM

## 2021-06-20 MED ORDER — TYVASO REFILL KIT 0.6 MG/ML IN SOLN: 18.0000 ug | Freq: Four times a day (QID) | RESPIRATORY_TRACT | 11 refills | Status: DC

## 2021-06-20 MED ORDER — TYVASO STARTER KIT 0.6 MG/ML IN SOLN: 18.0000 ug | Freq: Four times a day (QID) | RESPIRATORY_TRACT | 0 refills | Status: DC

## 2021-06-20 MED ORDER — MACITENTAN 10 MG PO TABS: 10.0000 mg | ORAL_TABLET | Freq: Every day | ORAL | 2 refills | Status: DC

## 2021-06-21 ENCOUNTER — Ambulatory Visit (HOSPITAL_COMMUNITY): Payer: Medicare HMO

## 2021-06-22 DIAGNOSIS — K219 Gastro-esophageal reflux disease without esophagitis: Secondary | ICD-10-CM | POA: Diagnosis not present

## 2021-06-22 DIAGNOSIS — I350 Nonrheumatic aortic (valve) stenosis: Secondary | ICD-10-CM | POA: Diagnosis not present

## 2021-06-22 DIAGNOSIS — J449 Chronic obstructive pulmonary disease, unspecified: Secondary | ICD-10-CM | POA: Diagnosis not present

## 2021-06-22 DIAGNOSIS — I2510 Atherosclerotic heart disease of native coronary artery without angina pectoris: Secondary | ICD-10-CM | POA: Diagnosis not present

## 2021-06-22 DIAGNOSIS — F32A Depression, unspecified: Secondary | ICD-10-CM | POA: Diagnosis not present

## 2021-06-22 DIAGNOSIS — I110 Hypertensive heart disease with heart failure: Secondary | ICD-10-CM | POA: Diagnosis not present

## 2021-06-22 DIAGNOSIS — E785 Hyperlipidemia, unspecified: Secondary | ICD-10-CM | POA: Diagnosis not present

## 2021-06-22 DIAGNOSIS — I5032 Chronic diastolic (congestive) heart failure: Secondary | ICD-10-CM | POA: Diagnosis not present

## 2021-06-22 DIAGNOSIS — J439 Emphysema, unspecified: Secondary | ICD-10-CM | POA: Diagnosis not present

## 2021-06-22 DIAGNOSIS — J9621 Acute and chronic respiratory failure with hypoxia: Secondary | ICD-10-CM | POA: Diagnosis not present

## 2021-06-24 ENCOUNTER — Ambulatory Visit (HOSPITAL_COMMUNITY): Payer: Medicare HMO

## 2021-06-25 DIAGNOSIS — I5032 Chronic diastolic (congestive) heart failure: Secondary | ICD-10-CM | POA: Diagnosis not present

## 2021-06-25 DIAGNOSIS — J9621 Acute and chronic respiratory failure with hypoxia: Secondary | ICD-10-CM | POA: Diagnosis not present

## 2021-06-25 DIAGNOSIS — J439 Emphysema, unspecified: Secondary | ICD-10-CM | POA: Diagnosis not present

## 2021-06-25 DIAGNOSIS — K219 Gastro-esophageal reflux disease without esophagitis: Secondary | ICD-10-CM | POA: Diagnosis not present

## 2021-06-25 DIAGNOSIS — F32A Depression, unspecified: Secondary | ICD-10-CM | POA: Diagnosis not present

## 2021-06-25 DIAGNOSIS — I2510 Atherosclerotic heart disease of native coronary artery without angina pectoris: Secondary | ICD-10-CM | POA: Diagnosis not present

## 2021-06-25 DIAGNOSIS — E785 Hyperlipidemia, unspecified: Secondary | ICD-10-CM | POA: Diagnosis not present

## 2021-06-25 DIAGNOSIS — I350 Nonrheumatic aortic (valve) stenosis: Secondary | ICD-10-CM | POA: Diagnosis not present

## 2021-06-25 DIAGNOSIS — I110 Hypertensive heart disease with heart failure: Secondary | ICD-10-CM | POA: Diagnosis not present

## 2021-06-26 ENCOUNTER — Ambulatory Visit (HOSPITAL_COMMUNITY): Payer: Medicare HMO

## 2021-06-27 ENCOUNTER — Telehealth: Payer: Self-pay | Admitting: Cardiology

## 2021-06-27 ENCOUNTER — Other Ambulatory Visit: Payer: Medicare HMO

## 2021-06-27 DIAGNOSIS — I2510 Atherosclerotic heart disease of native coronary artery without angina pectoris: Secondary | ICD-10-CM | POA: Diagnosis not present

## 2021-06-27 DIAGNOSIS — E785 Hyperlipidemia, unspecified: Secondary | ICD-10-CM | POA: Diagnosis not present

## 2021-06-27 DIAGNOSIS — F32A Depression, unspecified: Secondary | ICD-10-CM | POA: Diagnosis not present

## 2021-06-27 DIAGNOSIS — K219 Gastro-esophageal reflux disease without esophagitis: Secondary | ICD-10-CM | POA: Diagnosis not present

## 2021-06-27 DIAGNOSIS — J9621 Acute and chronic respiratory failure with hypoxia: Secondary | ICD-10-CM | POA: Diagnosis not present

## 2021-06-27 DIAGNOSIS — I350 Nonrheumatic aortic (valve) stenosis: Secondary | ICD-10-CM | POA: Diagnosis not present

## 2021-06-27 DIAGNOSIS — I110 Hypertensive heart disease with heart failure: Secondary | ICD-10-CM | POA: Diagnosis not present

## 2021-06-27 DIAGNOSIS — J439 Emphysema, unspecified: Secondary | ICD-10-CM | POA: Diagnosis not present

## 2021-06-27 DIAGNOSIS — I5032 Chronic diastolic (congestive) heart failure: Secondary | ICD-10-CM | POA: Diagnosis not present

## 2021-06-27 NOTE — Telephone Encounter (Signed)
Error

## 2021-06-28 ENCOUNTER — Ambulatory Visit (HOSPITAL_COMMUNITY): Payer: Medicare HMO

## 2021-06-28 DIAGNOSIS — F32A Depression, unspecified: Secondary | ICD-10-CM | POA: Diagnosis not present

## 2021-06-28 DIAGNOSIS — K219 Gastro-esophageal reflux disease without esophagitis: Secondary | ICD-10-CM | POA: Diagnosis not present

## 2021-06-28 DIAGNOSIS — I2510 Atherosclerotic heart disease of native coronary artery without angina pectoris: Secondary | ICD-10-CM | POA: Diagnosis not present

## 2021-06-28 DIAGNOSIS — J439 Emphysema, unspecified: Secondary | ICD-10-CM | POA: Diagnosis not present

## 2021-06-28 DIAGNOSIS — J9621 Acute and chronic respiratory failure with hypoxia: Secondary | ICD-10-CM | POA: Diagnosis not present

## 2021-06-28 DIAGNOSIS — E785 Hyperlipidemia, unspecified: Secondary | ICD-10-CM | POA: Diagnosis not present

## 2021-06-28 DIAGNOSIS — I110 Hypertensive heart disease with heart failure: Secondary | ICD-10-CM | POA: Diagnosis not present

## 2021-06-28 DIAGNOSIS — I350 Nonrheumatic aortic (valve) stenosis: Secondary | ICD-10-CM | POA: Diagnosis not present

## 2021-06-28 DIAGNOSIS — I5032 Chronic diastolic (congestive) heart failure: Secondary | ICD-10-CM | POA: Diagnosis not present

## 2021-07-01 ENCOUNTER — Ambulatory Visit (HOSPITAL_COMMUNITY): Payer: Medicare HMO

## 2021-07-01 ENCOUNTER — Other Ambulatory Visit: Payer: Self-pay | Admitting: Cardiology

## 2021-07-01 DIAGNOSIS — I2510 Atherosclerotic heart disease of native coronary artery without angina pectoris: Secondary | ICD-10-CM

## 2021-07-03 ENCOUNTER — Ambulatory Visit (HOSPITAL_COMMUNITY): Payer: Medicare HMO

## 2021-07-03 ENCOUNTER — Ambulatory Visit: Payer: Medicare HMO | Admitting: Cardiology

## 2021-07-05 ENCOUNTER — Ambulatory Visit (HOSPITAL_COMMUNITY): Payer: Medicare HMO

## 2021-07-05 ENCOUNTER — Other Ambulatory Visit: Payer: Medicare HMO

## 2021-07-05 ENCOUNTER — Ambulatory Visit: Payer: Medicare HMO | Admitting: Hematology and Oncology

## 2021-07-08 ENCOUNTER — Telehealth (HOSPITAL_COMMUNITY): Payer: Self-pay | Admitting: Internal Medicine

## 2021-07-08 ENCOUNTER — Ambulatory Visit (HOSPITAL_COMMUNITY): Payer: Medicare HMO

## 2021-07-08 NOTE — Progress Notes (Incomplete)
Patient Care Team: [name], MD as PCP - General (Internal Medicine)  DIAGNOSIS: No diagnosis found.  CHIEF COMPLIANT: Follow-up of polycythemia vera  INTERVAL HISTORY: Melissa Barron is a 87 y.o. 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 She presents to the clinic today for follow-up.   ALLERGIES:  is allergic to latex.  MEDICATIONS:  Current Outpatient Medications  Medication Sig Dispense Refill   amLODipine (NORVASC) 10 MG tablet Take 1 tablet (10 mg total) by mouth daily. 90 tablet 3   aspirin EC 81 MG tablet Take 1 tablet (81 mg total) by mouth daily. Swallow whole. 90 tablet 3   atorvastatin (LIPITOR) 80 MG tablet Take 80 mg by mouth daily.     bisoprolol (ZEBETA) 5 MG tablet Take 1 tablet (5 mg total) by mouth daily. 90 tablet 3   Cholecalciferol (VITAMIN D3) 250 MCG (10000 UT) capsule Take 1 capsule (10,000 Units total) by mouth daily.     clopidogrel (PLAVIX) 75 MG tablet TAKE 1 TABLET EVERY DAY (Patient taking differently: Take 75 mg by mouth daily.) 180 tablet 1   denosumab (PROLIA) 60 MG/ML SOSY injection Inject 60 mg into the skin every 6 (six) months.     furosemide (LASIX) 20 MG tablet Take 1 tablet (20 mg total) by mouth daily. 90 tablet 1   furosemide (LASIX) 40 MG tablet TAKE 1 TABLET BY MOUTH EVERY DAY 90 tablet 1   hydroxyurea (HYDREA) 500 MG capsule Take 1 capsule (500 mg total) by mouth daily. Mon through Friday. Off on weekends (Patient taking differently: Take 500 mg by mouth See admin instructions. Take 528m daily on Monday through Friday only. Off on weekends) 90 capsule 3   irbesartan (AVAPRO) 300 MG tablet Take 1 tablet (300 mg total) by mouth at bedtime. 90 tablet 3   isosorbide mononitrate (IMDUR) 30 MG 24 hr tablet TAKE 1 TABLET (30 MG TOTAL) BY MOUTH DAILY. 90 tablet 1   KLOR-CON M20 20 MEQ tablet TAKE 1 TABLET BY MOUTH EVERY DAY (Patient taking differently: Take 20 mEq by mouth daily.) 90 tablet 1    macitentan (OPSUMIT) 10 MG tablet Take 1 tablet (10 mg total) by mouth daily. 90 tablet 2   nitroGLYCERIN (NITROSTAT) 0.4 MG SL tablet Place 1 tablet (0.4 mg total) under the tongue every 5 (five) minutes x 3 doses as needed for chest pain. 25 tablet 3   nystatin ointment (MYCOSTATIN) Apply 1 application topically 2 (two) times daily as needed (rash).     pantoprazole (PROTONIX) 40 MG tablet TAKE 1 TABLET (40 MG TOTAL) BY MOUTH DAILY. 90 tablet 3   sertraline (ZOLOFT) 100 MG tablet Take 100 mg by mouth daily.     Tiotropium Bromide-Olodaterol (STIOLTO RESPIMAT) 2.5-2.5 MCG/ACT AERS Inhale 2 puffs into the lungs daily. 4 g 3   Treprostinil (TYVASO REFILL) 0.6 MG/ML SOLN Inhale 18 mcg into the lungs 4 (four) times daily. Titrate up by 3 inhalations every 1-2 weeks as tolerated to goal inhalation of 12 per dose. 2.9 mL 11   Treprostinil (TYVASO STARTER) 0.6 MG/ML SOLN Inhale 18 mcg into the lungs in the morning, at noon, in the evening, and at bedtime. 2.9 mL 0   No current facility-administered medications for this visit.    PHYSICAL EXAMINATION: ECOG PERFORMANCE STATUS: {CHL ONC ECOG [id number]}  There were no vitals filed for this visit. There were no vitals filed for this visit.  LABORATORY DATA:  I have reviewed the  data as listed CMP Latest Ref Rng & Units 06/18/2021 06/18/2021 06/17/2021  Glucose 70 - 99 mg/dL - - 96  BUN 8 - 23 mg/dL - - 20  Creatinine 0.44 - 1.00 mg/dL - - 1.08(H)  Sodium 135 - 145 mmol/L 143 142 141  Potassium 3.5 - 5.1 mmol/L 3.7 3.8 4.1  Chloride 98 - 111 mmol/L - - 108  CO2 22 - 32 mmol/L - - 27  Calcium 8.9 - 10.3 mg/dL - - 8.6(L)  Total Protein 6.5 - 8.1 g/dL - - 5.9(L)  Total Bilirubin 0.3 - 1.2 mg/dL - - 1.0  Alkaline Phos 38 - 126 U/L - - 56  AST 15 - 41 U/L - - 24  ALT 0 - 44 U/L - - 25    Lab Results  Component Value Date   WBC 14.3 (H) 06/17/2021   HGB 15.3 (H) 06/18/2021   HGB 15.0 06/18/2021   HCT 45.0 06/18/2021   HCT 44.0 06/18/2021    MCV 89.1 06/17/2021   PLT 639 (H) 06/17/2021   NEUTROABS 5.8 01/03/2021    ASSESSMENT & PLAN:  No problem-specific Assessment & Plan notes found for this encounter.    No orders of the defined types were placed in this encounter.  The patient has a good understanding of the overall plan. she agrees with it. she will call with any problems that may develop before the next visit here.  Total time spent: *** mins including face to face time and time spent for planning, charting and coordination of care  Rulon Eisenmenger, MD, MPH 07/08/2021  I, Thana Ates, am acting as scribe for Dr. Nicholas Lose.  {insert scribe attestation}

## 2021-07-09 ENCOUNTER — Ambulatory Visit: Payer: Medicare HMO | Admitting: Hematology and Oncology

## 2021-07-09 ENCOUNTER — Other Ambulatory Visit: Payer: Medicare HMO

## 2021-07-09 ENCOUNTER — Telehealth: Payer: Self-pay | Admitting: Hematology and Oncology

## 2021-07-09 NOTE — Assessment & Plan Note (Deleted)
Diagnosed at [facility] Current treatment: Hydroxyurea 500 mg daily (5 days a week since 01/03/2021) History of COPD, depression, hypertension, hypercholesterolemia, osteoporosis, GERD, CAD Hydroxyurea toxicities:None: She is tolerating it extremely well.  Lab review: 05/11/2020: Hemoglobin 14.3, WBC 7.9, MCV 106.8, platelets 260 01/03/2021: Hemoglobin 13.7, WBC 7.3, MCV 103.9, platelets 254 07/09/2021:  Hospitalization for coronary artery disease  I agree with continuing the current treatment. Return to clinic every13month with labs and follow-up.

## 2021-07-09 NOTE — Telephone Encounter (Signed)
Cancelled per 11/8 pt request, said they will call back to r/s

## 2021-07-10 ENCOUNTER — Ambulatory Visit (HOSPITAL_COMMUNITY): Payer: Medicare HMO

## 2021-07-11 ENCOUNTER — Other Ambulatory Visit: Payer: Self-pay | Admitting: Cardiology

## 2021-07-11 DIAGNOSIS — I2721 Secondary pulmonary arterial hypertension: Secondary | ICD-10-CM

## 2021-07-12 ENCOUNTER — Ambulatory Visit (HOSPITAL_COMMUNITY): Payer: Medicare HMO

## 2021-07-12 DIAGNOSIS — J9621 Acute and chronic respiratory failure with hypoxia: Secondary | ICD-10-CM | POA: Diagnosis not present

## 2021-07-12 DIAGNOSIS — F32A Depression, unspecified: Secondary | ICD-10-CM | POA: Diagnosis not present

## 2021-07-12 DIAGNOSIS — K219 Gastro-esophageal reflux disease without esophagitis: Secondary | ICD-10-CM | POA: Diagnosis not present

## 2021-07-12 DIAGNOSIS — J439 Emphysema, unspecified: Secondary | ICD-10-CM | POA: Diagnosis not present

## 2021-07-12 DIAGNOSIS — I2721 Secondary pulmonary arterial hypertension: Secondary | ICD-10-CM | POA: Diagnosis not present

## 2021-07-12 DIAGNOSIS — E785 Hyperlipidemia, unspecified: Secondary | ICD-10-CM | POA: Diagnosis not present

## 2021-07-12 DIAGNOSIS — I110 Hypertensive heart disease with heart failure: Secondary | ICD-10-CM | POA: Diagnosis not present

## 2021-07-12 DIAGNOSIS — I5032 Chronic diastolic (congestive) heart failure: Secondary | ICD-10-CM | POA: Diagnosis not present

## 2021-07-12 DIAGNOSIS — I2510 Atherosclerotic heart disease of native coronary artery without angina pectoris: Secondary | ICD-10-CM | POA: Diagnosis not present

## 2021-07-12 DIAGNOSIS — I350 Nonrheumatic aortic (valve) stenosis: Secondary | ICD-10-CM | POA: Diagnosis not present

## 2021-07-13 DIAGNOSIS — I110 Hypertensive heart disease with heart failure: Secondary | ICD-10-CM | POA: Diagnosis not present

## 2021-07-13 DIAGNOSIS — K219 Gastro-esophageal reflux disease without esophagitis: Secondary | ICD-10-CM | POA: Diagnosis not present

## 2021-07-13 DIAGNOSIS — I5032 Chronic diastolic (congestive) heart failure: Secondary | ICD-10-CM | POA: Diagnosis not present

## 2021-07-13 DIAGNOSIS — I350 Nonrheumatic aortic (valve) stenosis: Secondary | ICD-10-CM | POA: Diagnosis not present

## 2021-07-13 DIAGNOSIS — F32A Depression, unspecified: Secondary | ICD-10-CM | POA: Diagnosis not present

## 2021-07-13 DIAGNOSIS — J439 Emphysema, unspecified: Secondary | ICD-10-CM | POA: Diagnosis not present

## 2021-07-13 DIAGNOSIS — J9621 Acute and chronic respiratory failure with hypoxia: Secondary | ICD-10-CM | POA: Diagnosis not present

## 2021-07-13 DIAGNOSIS — E785 Hyperlipidemia, unspecified: Secondary | ICD-10-CM | POA: Diagnosis not present

## 2021-07-13 DIAGNOSIS — I2510 Atherosclerotic heart disease of native coronary artery without angina pectoris: Secondary | ICD-10-CM | POA: Diagnosis not present

## 2021-07-15 ENCOUNTER — Ambulatory Visit (HOSPITAL_COMMUNITY): Payer: Medicare HMO

## 2021-07-16 DIAGNOSIS — K219 Gastro-esophageal reflux disease without esophagitis: Secondary | ICD-10-CM | POA: Diagnosis not present

## 2021-07-16 DIAGNOSIS — I110 Hypertensive heart disease with heart failure: Secondary | ICD-10-CM | POA: Diagnosis not present

## 2021-07-16 DIAGNOSIS — J439 Emphysema, unspecified: Secondary | ICD-10-CM | POA: Diagnosis not present

## 2021-07-16 DIAGNOSIS — E785 Hyperlipidemia, unspecified: Secondary | ICD-10-CM | POA: Diagnosis not present

## 2021-07-16 DIAGNOSIS — I5032 Chronic diastolic (congestive) heart failure: Secondary | ICD-10-CM | POA: Diagnosis not present

## 2021-07-16 DIAGNOSIS — J9621 Acute and chronic respiratory failure with hypoxia: Secondary | ICD-10-CM | POA: Diagnosis not present

## 2021-07-16 DIAGNOSIS — I2510 Atherosclerotic heart disease of native coronary artery without angina pectoris: Secondary | ICD-10-CM | POA: Diagnosis not present

## 2021-07-16 DIAGNOSIS — I350 Nonrheumatic aortic (valve) stenosis: Secondary | ICD-10-CM | POA: Diagnosis not present

## 2021-07-16 DIAGNOSIS — F32A Depression, unspecified: Secondary | ICD-10-CM | POA: Diagnosis not present

## 2021-07-17 ENCOUNTER — Ambulatory Visit (HOSPITAL_COMMUNITY): Payer: Medicare HMO

## 2021-07-17 LAB — C-REACTIVE PROTEIN: CRP: 2 mg/L (ref 0–10)

## 2021-07-17 LAB — ANA+ENA+DNA/DS+SCL 70+SJOSSA/B
ANA Titer 1: POSITIVE — AB
ENA RNP Ab: <0.2 AI (ref 0.0–0.9)
ENA SM Ab Ser-aCnc: 1.3 AI — ABNORMAL HIGH (ref 0.0–0.9)
ENA SSA (RO) Ab: <0.2 AI (ref 0.0–0.9)
ENA SSB (LA) Ab: <0.2 AI (ref 0.0–0.9)
Scleroderma (Scl-70) (ENA) Antibody, IgG: <0.2 AI (ref 0.0–0.9)
dsDNA Ab: 1 IU/mL (ref 0–9)

## 2021-07-17 LAB — FANA STAINING PATTERNS
Homogeneous Pattern: 1:160 {titer} — ABNORMAL HIGH
Speckled Pattern: 1:80 {titer}

## 2021-07-17 LAB — HIV ANTIBODY (ROUTINE TESTING W REFLEX): HIV Screen 4th Generation wRfx: NONREACTIVE

## 2021-07-17 LAB — SEDIMENTATION RATE: Sed Rate: 2 mm/hr (ref 0–40)

## 2021-07-18 ENCOUNTER — Other Ambulatory Visit: Payer: Self-pay

## 2021-07-18 ENCOUNTER — Encounter: Payer: Self-pay | Admitting: Cardiology

## 2021-07-18 ENCOUNTER — Ambulatory Visit: Payer: Medicare HMO | Admitting: Cardiology

## 2021-07-18 VITALS — BP 149/55 | HR 73 | Temp 98.0°F | Resp 16 | Ht 59.0 in | Wt 115.0 lb

## 2021-07-18 DIAGNOSIS — I2721 Secondary pulmonary arterial hypertension: Secondary | ICD-10-CM | POA: Diagnosis not present

## 2021-07-18 DIAGNOSIS — I25118 Atherosclerotic heart disease of native coronary artery with other forms of angina pectoris: Secondary | ICD-10-CM

## 2021-07-18 DIAGNOSIS — I10 Essential (primary) hypertension: Secondary | ICD-10-CM | POA: Diagnosis not present

## 2021-07-18 MED ORDER — FUROSEMIDE 40 MG PO TABS: 20.0000 mg | ORAL_TABLET | Freq: Two times a day (BID) | ORAL | 3 refills | Status: AC

## 2021-07-18 MED ORDER — ATORVASTATIN CALCIUM 40 MG PO TABS: 80.0000 mg | ORAL_TABLET | Freq: Every day | ORAL | 3 refills | Status: DC

## 2021-07-18 NOTE — Progress Notes (Signed)
Patient referred by [name], MD for coronary artery disease  Subjective:   Melissa Barron, female    DOB: 1931/08/24, 87 y.o.   MRN: [id number]   Chief Complaint  Patient presents with   [facility]   Coronary Artery Disease   Dizziness   Shortness of Breath   Follow-up    4 week    87 y.o. Caucasian female with hypertension, hyperlipidemia, CAD, COPD, depression, GERD, now with PAH  See details below re: echocardiogram, RHC. I discussed with patient's pulmonologist Dr. Silas Flood. We both agreed that her PAH was out of proportion to her other stable COPD. She has conitnued to require 4 L oxygen 24X7.   She was started on Opsumit (Macitentan) 10 mg daily. Patient assistance for inhaled Tyvaso (Treprostinil) is pending. She has not had any major side effects with Opsumit, other than hair loss. She has noticed episodes of epistaxis since being on 24X7 supplemental oxygen.  Recent labs showed elevated ANA titer. VQ scan is pending.   Current Outpatient Medications on File Prior to Visit  Medication Sig Dispense Refill   amLODipine (NORVASC) 10 MG tablet Take 1 tablet (10 mg total) by mouth daily. 90 tablet 3   aspirin EC 81 MG tablet Take 1 tablet (81 mg total) by mouth daily. Swallow whole. 90 tablet 3   atorvastatin (LIPITOR) 80 MG tablet Take 80 mg by mouth daily.     bisoprolol (ZEBETA) 5 MG tablet Take 1 tablet (5 mg total) by mouth daily. 90 tablet 3   Cholecalciferol (VITAMIN D3) 250 MCG (10000 UT) capsule Take 1 capsule (10,000 Units total) by mouth daily.     clopidogrel (PLAVIX) 75 MG tablet TAKE 1 TABLET EVERY DAY (Patient taking differently: Take 75 mg by mouth daily.) 180 tablet 1   denosumab (PROLIA) 60 MG/ML SOSY injection Inject 60 mg into the skin every 6 (six) months.     furosemide (LASIX) 20 MG tablet Take 1 tablet (20 mg total) by mouth daily. 90 tablet 1   furosemide (LASIX) 40 MG tablet TAKE 1 TABLET BY MOUTH EVERY DAY 90 tablet 1   hydroxyurea (HYDREA) 500 MG  capsule Take 1 capsule (500 mg total) by mouth daily. Mon through Friday. Off on weekends (Patient taking differently: Take 500 mg by mouth See admin instructions. Take 540m daily on Monday through Friday only. Off on weekends) 90 capsule 3   irbesartan (AVAPRO) 300 MG tablet Take 1 tablet (300 mg total) by mouth at bedtime. 90 tablet 3   isosorbide mononitrate (IMDUR) 30 MG 24 hr tablet TAKE 1 TABLET (30 MG TOTAL) BY MOUTH DAILY. 90 tablet 1   KLOR-CON M20 20 MEQ tablet TAKE 1 TABLET BY MOUTH EVERY DAY (Patient taking differently: Take 20 mEq by mouth daily.) 90 tablet 1   macitentan (OPSUMIT) 10 MG tablet Take 1 tablet (10 mg total) by mouth daily. 90 tablet 2   nitroGLYCERIN (NITROSTAT) 0.4 MG SL tablet Place 1 tablet (0.4 mg total) under the tongue every 5 (five) minutes x 3 doses as needed for chest pain. 25 tablet 3   nystatin ointment (MYCOSTATIN) Apply 1 application topically 2 (two) times daily as needed (rash).     pantoprazole (PROTONIX) 40 MG tablet TAKE 1 TABLET (40 MG TOTAL) BY MOUTH DAILY. 90 tablet 3   sertraline (ZOLOFT) 100 MG tablet Take 100 mg by mouth daily.     Tiotropium Bromide-Olodaterol (STIOLTO RESPIMAT) 2.5-2.5 MCG/ACT AERS Inhale 2 puffs into the lungs daily. 4  g 3   Treprostinil (TYVASO REFILL) 0.6 MG/ML SOLN Inhale 18 mcg into the lungs 4 (four) times daily. Titrate up by 3 inhalations every 1-2 weeks as tolerated to goal inhalation of 12 per dose. 2.9 mL 11   Treprostinil (TYVASO STARTER) 0.6 MG/ML SOLN Inhale 18 mcg into the lungs in the morning, at noon, in the evening, and at bedtime. 2.9 mL 0   No current facility-administered medications on file prior to visit.    Cardiovascular and other pertinent studies:  Echocardiogram 06/19/2021:  Left ventricle cavity is normal in size. Mild concentric hypertrophy of  the left ventricle. Systolic flattening of interventricular septum  suggests RV pressure overload.  Normal LV systolic function with visual EF  65-70%.  Doppler evidence of grade I (impaired) diastolic dysfunction,  normal LAP.  Left atrial cavity is moderately dilated. The interatrial Septum is thin  and mobile but appears to be intact by 2D and CF Doppler interrogation.  Septum bulges towards left atrium suggesting RA pressure overload.  Right atrial cavity is moderately dilated.  Right ventricle cavity is moderately dilated. Normal right ventricular  function.  Trileaflet aortic valve with mild aortic valve leaflet calcification.  Moderate aortic stenosis. Vmax 3.5 m/sec, mean PG 27 mmHg, AVA 1.2 cm by  continuity equation. Moderate (Grade II) aortic regurgitation.  Mild mitral valve leaflet calcification. Mild restriction of leaflets  without significant stenosis. Mild (Grade I) mitral regurgitation.  Moderate to severe tricuspid regurgitation. At least moderate pulmonary  hypertension. Estimated pulmonary artery systolic pressure at least 57  mmHg.  Mild pulmonic regurgitation.  IVC is dilated with a respiratory response of <50%. Estimated right atrial  pressure at least 15 mmHg.  Compared to previous study on 01/01/2021, tricuspid regurgitation has  increased from mild to mod-severe, estimated PASP increased from 37 mmHg  to at least 57 mmHg, RA/RV dilatation is new. Aortic valve stenosis  (previously reported as LVOT obstruction) is unchanged.    [place] 06/18/2021: RA: 4 mmHg RV: 76/2 mmHg PA: 75/23 mmHg, mPAP 42 mmHg PCW: 6-7 mmHg (PCW was checked and confirmed in multiple locations-RU,RL,LU)   CO: 2.9 L/min CI: 2.0 L/min/m2   PVR: 12 WU   Moderate pulmonary hypertension, WHO Grp I    Lexiscan (Walking with mod Bruce)Tetrofosmin Stress Test: 06/12/2021: 1 Day Rest/Stress Protocol. Stress EKG is non-diagnostic, as this is pharmacological stress test using Lexiscan. Normal myocardial perfusion without convincing evidence of reversible myocardial ischemia or prior infarct. Left ventricular wall thickness preserved, no  regional wall motion abnormalities, calculated LVEF 79%. Low risk study. No prior studies for comparison.  EKG 05/24/2021: Sinus rhythm 56 bpm First degree A-V block  Anteroseptal infarct -age undetermined Nonspecific T wave inversion lead III  Echocardiogram 01/01/2021: Left ventricle cavity is normal in size. Moderate concentric hypertrophy of the left ventricle. Normal global wall motion. Normal LV systolic function with EF 63%. Doppler evidence of grade I (impaired) diastolic dysfunction, normal LAP.  Left atrial cavity is moderately dilated. Trileaflet aortic valve with moderate calcification. Transvalvular mean PG 28 mmHg, max PG 44 mmHg, likely originating predominantly from LVOT than true valvular stenosis.  Moderate (grade II) aortic regurgitation. Mild tricuspid regurgitation. Estimated pulmonary artery systolic pressure 37 mmHg.  No significant change compared to previous study on 06/26/2020.   Coronary intervention 12/28/2020: LM: Distal 20% stenosis LAD: Tortuous vessel        Prox-mid prior stent with focal 80% ISR        Attempted orbital atherectomy  Shockwave lithotripsy        PTCA and stent placement 3.5 X 12 mm Synergy drug-eluting stent        Intravascular ultrasound (IVUS) MSA 7.1 mm2        80%-->0% residual stenosis        TIMI flow II-->III LCx: Tortuous vessel          Mid focal 40% stenosis RCA: Prox-mid prior stent         Mid 40% ISR         RV marginal branch with prox 90% diffuse disease   LV-Ao Peak-to-peak gradient 21 mmHg LV-Ao Mean PG 20 mmHg Moderate aortic stenosis   Carotid artery duplex 06/26/2020:  Doppler velocity suggests stenosis in the right internal carotid artery  (16-49%). Stenosis in the right external carotid artery (<50%).  Doppler velocity suggests stenosis in the left internal carotid artery  (50-69%). Stenosis in the left external carotid artery (<50%).  Antegrade right vertebral artery flow. Antegrade left vertebral  artery  flow.  Follow up in six months is appropriate if clinically indicated.  Recent labs: 06/10/2021: Glucose 91, BUN/Cr 19/1.01. EGFR 54. Na/K 142/4.5.  BNP 849 H/H 16/49. MCV 88. Platelets 631  02/14/2021: BNP 790 high  01/03/2021: H/H 13.7/42.4. MCV 103.9. Platelets 254  05/12/2020: Glucose 80, BUN/Cr 17/0.7. EGFR 79. Na/K 137/4.1. Rest of the CMP normal H/H 14/44. MCV 106. Platelets 280 HbA1C N/A Chol 124, TG 106, HDL 42, LDL 61 TSH N/A    Review of Systems  Constitutional: Positive for malaise/fatigue.  Cardiovascular:  Positive for chest pain and dyspnea on exertion (Worsening). Negative for leg swelling, palpitations and syncope.  Respiratory:  Positive for shortness of breath (Worsening).         Vitals:   07/18/21 1157  BP: (!) 149/55  Pulse: 73  Resp: 16  Temp: 98 F (36.7 C)  SpO2: (!) 79%    Body mass index is 23.23 kg/m. Filed Weights   07/18/21 1157  Weight: 115 lb (52.2 kg)      Objective:   Physical Exam Vitals and nursing note reviewed.  Constitutional:      General: She is not in acute distress.    Appearance: She is underweight.     Comments: On supplemental oxygen  Neck:     Vascular: No JVD.  Cardiovascular:     Rate and Rhythm: Normal rate and regular rhythm.     Pulses:          Carotid pulses are  on the right side with bruit.    Heart sounds: Murmur heard.  Pulmonary:     Effort: Pulmonary effort is normal.     Breath sounds: No wheezing or rales.  Musculoskeletal:     Right lower leg: Edema (2+) present.     Left lower leg: Edema (2+) present.        Assessment & Recommendations:   87 y.o. 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 Will obtain rheumatology consult for CTD workup and management VQ scan pending  Continue Opsumit (Macitentan) 10 mg daily Pending Tyvaso (inhaled treprostinil) Increase lasix to 40 mg bid.  Will consider starting sildenafil 20 mg tid after VQ scan  CAD: History of LAD restenosis,  last PCI 11/2020. Stop plavix. Continue Aspirin 81 mg daily Refilled Lipitor at 40 mg daily.  Aortic stenosis, aortic regurgitation: Moderate.  Continue serial echocardiogram monitoring  Hypertension: Controlled.   Time spent: 50 min  F/u in 4 weeks   Nigel Mormon, MD Pager: [phone] Office: [phone]

## 2021-07-19 ENCOUNTER — Ambulatory Visit (HOSPITAL_COMMUNITY): Payer: Medicare HMO

## 2021-07-19 ENCOUNTER — Other Ambulatory Visit: Payer: Self-pay | Admitting: Pharmacist

## 2021-07-19 MED ORDER — ATORVASTATIN CALCIUM 40 MG PO TABS: 40.0000 mg | ORAL_TABLET | Freq: Every day | ORAL | 3 refills | Status: DC

## 2021-07-20 DIAGNOSIS — I350 Nonrheumatic aortic (valve) stenosis: Secondary | ICD-10-CM | POA: Diagnosis not present

## 2021-07-20 DIAGNOSIS — I5032 Chronic diastolic (congestive) heart failure: Secondary | ICD-10-CM | POA: Diagnosis not present

## 2021-07-20 DIAGNOSIS — I110 Hypertensive heart disease with heart failure: Secondary | ICD-10-CM | POA: Diagnosis not present

## 2021-07-20 DIAGNOSIS — K219 Gastro-esophageal reflux disease without esophagitis: Secondary | ICD-10-CM | POA: Diagnosis not present

## 2021-07-20 DIAGNOSIS — J439 Emphysema, unspecified: Secondary | ICD-10-CM | POA: Diagnosis not present

## 2021-07-20 DIAGNOSIS — J9621 Acute and chronic respiratory failure with hypoxia: Secondary | ICD-10-CM | POA: Diagnosis not present

## 2021-07-20 DIAGNOSIS — E785 Hyperlipidemia, unspecified: Secondary | ICD-10-CM | POA: Diagnosis not present

## 2021-07-20 DIAGNOSIS — F32A Depression, unspecified: Secondary | ICD-10-CM | POA: Diagnosis not present

## 2021-07-20 DIAGNOSIS — I2510 Atherosclerotic heart disease of native coronary artery without angina pectoris: Secondary | ICD-10-CM | POA: Diagnosis not present

## 2021-07-22 ENCOUNTER — Ambulatory Visit (HOSPITAL_COMMUNITY): Payer: Medicare HMO

## 2021-07-23 DIAGNOSIS — I110 Hypertensive heart disease with heart failure: Secondary | ICD-10-CM | POA: Diagnosis not present

## 2021-07-23 DIAGNOSIS — J9621 Acute and chronic respiratory failure with hypoxia: Secondary | ICD-10-CM | POA: Diagnosis not present

## 2021-07-23 DIAGNOSIS — F32A Depression, unspecified: Secondary | ICD-10-CM | POA: Diagnosis not present

## 2021-07-23 DIAGNOSIS — K219 Gastro-esophageal reflux disease without esophagitis: Secondary | ICD-10-CM | POA: Diagnosis not present

## 2021-07-23 DIAGNOSIS — J439 Emphysema, unspecified: Secondary | ICD-10-CM | POA: Diagnosis not present

## 2021-07-23 DIAGNOSIS — I350 Nonrheumatic aortic (valve) stenosis: Secondary | ICD-10-CM | POA: Diagnosis not present

## 2021-07-23 DIAGNOSIS — I2510 Atherosclerotic heart disease of native coronary artery without angina pectoris: Secondary | ICD-10-CM | POA: Diagnosis not present

## 2021-07-23 DIAGNOSIS — E785 Hyperlipidemia, unspecified: Secondary | ICD-10-CM | POA: Diagnosis not present

## 2021-07-23 DIAGNOSIS — I5032 Chronic diastolic (congestive) heart failure: Secondary | ICD-10-CM | POA: Diagnosis not present

## 2021-07-23 NOTE — Progress Notes (Signed)
Patient Care Team: [name], MD as PCP - General (Internal Medicine)  DIAGNOSIS:    ICD-10-CM   1. Polycythemia vera ([place])  D45 CBC with Differential ([facility]cer [facility] Only)      CHIEF COMPLIANT: Follow-up of polycythemia vera  INTERVAL HISTORY: Melissa Barron is a 87 y.o. 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 She was admitted from 12/27/20-12/29/20 following evaluation for exertional chest pain and dyspnea on exertion and underwent cardiac catheterization complex angioplasty. She presents to the clinic today for follow-up.  Her major complaints are related to shortness of breath.  She is tolerating hydroxyurea extremely well.  ALLERGIES:  is allergic to latex.  MEDICATIONS:  Current Outpatient Medications  Medication Sig Dispense Refill   amLODipine (NORVASC) 10 MG tablet Take 1 tablet (10 mg total) by mouth daily. 90 tablet 3   aspirin EC 81 MG tablet Take 1 tablet (81 mg total) by mouth daily. Swallow whole. 90 tablet 3   atorvastatin (LIPITOR) 40 MG tablet Take 1 tablet (40 mg total) by mouth daily. 90 tablet 3   bisoprolol (ZEBETA) 5 MG tablet Take 1 tablet (5 mg total) by mouth daily. 90 tablet 3   Cholecalciferol (VITAMIN D3) 250 MCG (10000 UT) capsule Take 1 capsule (10,000 Units total) by mouth daily.     denosumab (PROLIA) 60 MG/ML SOSY injection Inject 60 mg into the skin every 6 (six) months.     furosemide (LASIX) 40 MG tablet Take 0.5 tablets (20 mg total) by mouth 2 (two) times daily. 120 tablet 3   hydroxyurea (HYDREA) 500 MG capsule Take 1 capsule (500 mg total) by mouth daily. 90 capsule 3   irbesartan (AVAPRO) 300 MG tablet Take 1 tablet (300 mg total) by mouth at bedtime. 90 tablet 3   isosorbide mononitrate (IMDUR) 30 MG 24 hr tablet TAKE 1 TABLET (30 MG TOTAL) BY MOUTH DAILY. 90 tablet 1   KLOR-CON M20 20 MEQ tablet TAKE 1 TABLET BY MOUTH EVERY DAY (Patient taking differently: Take 20 mEq by mouth daily.) 90  tablet 1   macitentan (OPSUMIT) 10 MG tablet Take 1 tablet (10 mg total) by mouth daily. 90 tablet 2   nitroGLYCERIN (NITROSTAT) 0.4 MG SL tablet Place 1 tablet (0.4 mg total) under the tongue every 5 (five) minutes x 3 doses as needed for chest pain. 25 tablet 3   nystatin ointment (MYCOSTATIN) Apply 1 application topically 2 (two) times daily as needed (rash).     pantoprazole (PROTONIX) 40 MG tablet TAKE 1 TABLET (40 MG TOTAL) BY MOUTH DAILY. 90 tablet 3   sertraline (ZOLOFT) 100 MG tablet Take 100 mg by mouth daily.     Tiotropium Bromide-Olodaterol (STIOLTO RESPIMAT) 2.5-2.5 MCG/ACT AERS Inhale 2 puffs into the lungs daily. 4 g 3   Treprostinil (TYVASO REFILL) 0.6 MG/ML SOLN Inhale 18 mcg into the lungs 4 (four) times daily. Titrate up by 3 inhalations every 1-2 weeks as tolerated to goal inhalation of 12 per dose. (Patient not taking: Reported on 07/18/2021) 2.9 mL 11   Treprostinil (TYVASO STARTER) 0.6 MG/ML SOLN Inhale 18 mcg into the lungs in the morning, at noon, in the evening, and at bedtime. (Patient not taking: Reported on 07/18/2021) 2.9 mL 0   No current facility-administered medications for this visit.    PHYSICAL EXAMINATION: ECOG PERFORMANCE STATUS: 1 - Symptomatic but completely ambulatory  Vitals:   07/24/21 1515  BP: 119/63  Pulse: (!) 19  Resp: 19  Temp: 97.9 F (36.6 C)  SpO2: 93%   Filed Weights   07/24/21 1515  Weight: 111 lb 3.2 oz (50.4 kg)    LABORATORY DATA:  I have reviewed the data as listed CMP Latest Ref Rng & Units 06/18/2021 06/18/2021 06/17/2021  Glucose 70 - 99 mg/dL - - 96  BUN 8 - 23 mg/dL - - 20  Creatinine 0.44 - 1.00 mg/dL - - 1.08(H)  Sodium 135 - 145 mmol/L 143 142 141  Potassium 3.5 - 5.1 mmol/L 3.7 3.8 4.1  Chloride 98 - 111 mmol/L - - 108  CO2 22 - 32 mmol/L - - 27  Calcium 8.9 - 10.3 mg/dL - - 8.6(L)  Total Protein 6.5 - 8.1 g/dL - - 5.9(L)  Total Bilirubin 0.3 - 1.2 mg/dL - - 1.0  Alkaline Phos 38 - 126 U/L - - 56  AST 15 -  41 U/L - - 24  ALT 0 - 44 U/L - - 25    Lab Results  Component Value Date   WBC 13.9 (H) 07/24/2021   HGB 14.2 07/24/2021   HCT 44.6 07/24/2021   MCV 86.3 07/24/2021   PLT 636 (H) 07/24/2021   NEUTROABS 10.8 (H) 07/24/2021    ASSESSMENT & PLAN:  Polycythemia vera ([place]) Diagnosed at [facility] Current treatment: Hydroxyurea 500 mg daily History of COPD, depression, hypertension, hypercholesterolemia, osteoporosis, GERD, CAD Hydroxyurea toxicities: None: She is tolerating it extremely well.   Lab review: 05/11/2020: Hemoglobin 14.3, WBC 7.9, MCV 106.8, platelets 260 01/03/2021: Hemoglobin 13.7, WBC 7.3, MCV 103.9, platelets 254 06/17/2021: Hemoglobin 15.3, WBC 14.3, platelets 639 07/24/2021: Hemoglobin 14.2, platelets 636, WBC 13.9    MCV appears to be normal and it is suggesting that the patient may not be taking Hydrea as recommended Previously we decreased the dosage of Hydrea to 5 days a week starting 01/03/2021. I now recommend that we increase the Hydrea to 7 days a week   Hospitalization for coronary artery disease   Return to clinic every 6 months with labs and follow-up.    Orders Placed This Encounter  Procedures   CBC with Differential ([place] Only)    Standing Status:   Future    Standing Expiration Date:   07/24/2022    The patient has a good understanding of the overall plan. she agrees with it. she will call with any problems that may develop before the next visit here.  Total time spent: 20 mins including face to face time and time spent for planning, charting and coordination of care  Rulon Eisenmenger, MD, MPH 07/24/2021  I, Thana Ates, am acting as scribe for Dr. Nicholas Lose.  I have reviewed the above documentation for accuracy and completeness, and I agree with the above.

## 2021-07-24 ENCOUNTER — Inpatient Hospital Stay: Payer: Medicare HMO

## 2021-07-24 ENCOUNTER — Other Ambulatory Visit: Payer: Self-pay

## 2021-07-24 ENCOUNTER — Other Ambulatory Visit: Payer: Medicare HMO

## 2021-07-24 ENCOUNTER — Ambulatory Visit (HOSPITAL_COMMUNITY): Payer: Medicare HMO

## 2021-07-24 ENCOUNTER — Inpatient Hospital Stay: Payer: Medicare HMO | Attending: Hematology and Oncology | Admitting: Hematology and Oncology

## 2021-07-24 DIAGNOSIS — Z7982 Long term (current) use of aspirin: Secondary | ICD-10-CM | POA: Diagnosis not present

## 2021-07-24 DIAGNOSIS — K219 Gastro-esophageal reflux disease without esophagitis: Secondary | ICD-10-CM | POA: Diagnosis not present

## 2021-07-24 DIAGNOSIS — Z7964 Long term (current) use of myelosuppressive agent: Secondary | ICD-10-CM | POA: Diagnosis not present

## 2021-07-24 DIAGNOSIS — Z79899 Other long term (current) drug therapy: Secondary | ICD-10-CM | POA: Insufficient documentation

## 2021-07-24 DIAGNOSIS — J449 Chronic obstructive pulmonary disease, unspecified: Secondary | ICD-10-CM | POA: Insufficient documentation

## 2021-07-24 DIAGNOSIS — D45 Polycythemia vera: Secondary | ICD-10-CM | POA: Diagnosis not present

## 2021-07-24 DIAGNOSIS — E7800 Pure hypercholesterolemia, unspecified: Secondary | ICD-10-CM | POA: Diagnosis not present

## 2021-07-24 DIAGNOSIS — I2510 Atherosclerotic heart disease of native coronary artery without angina pectoris: Secondary | ICD-10-CM | POA: Insufficient documentation

## 2021-07-24 DIAGNOSIS — F32A Depression, unspecified: Secondary | ICD-10-CM | POA: Insufficient documentation

## 2021-07-24 DIAGNOSIS — I10 Essential (primary) hypertension: Secondary | ICD-10-CM | POA: Diagnosis not present

## 2021-07-24 LAB — CBC WITH DIFFERENTIAL (CANCER CENTER ONLY)
Abs Immature Granulocytes: 0.06 10*3/uL (ref 0.00–0.07)
Basophils Absolute: 0.3 10*3/uL — ABNORMAL HIGH (ref 0.0–0.1)
Basophils Relative: 2 %
Eosinophils Absolute: 0.6 10*3/uL — ABNORMAL HIGH (ref 0.0–0.5)
Eosinophils Relative: 4 %
HCT: 44.6 % (ref 36.0–46.0)
Hemoglobin: 14.2 g/dL (ref 12.0–15.0)
Immature Granulocytes: 0 %
Lymphocytes Relative: 9 %
Lymphs Abs: 1.2 10*3/uL (ref 0.7–4.0)
MCH: 27.5 pg (ref 26.0–34.0)
MCHC: 31.8 g/dL (ref 30.0–36.0)
MCV: 86.3 fL (ref 80.0–100.0)
Monocytes Absolute: 1 10*3/uL (ref 0.1–1.0)
Monocytes Relative: 7 %
Neutro Abs: 10.8 10*3/uL — ABNORMAL HIGH (ref 1.7–7.7)
Neutrophils Relative %: 78 %
Platelet Count: 636 10*3/uL — ABNORMAL HIGH (ref 150–400)
RBC: 5.17 MIL/uL — ABNORMAL HIGH (ref 3.87–5.11)
RDW: 17.4 % — ABNORMAL HIGH (ref 11.5–15.5)
WBC Count: 13.9 10*3/uL — ABNORMAL HIGH (ref 4.0–10.5)
nRBC: 0 % (ref 0.0–0.2)

## 2021-07-24 MED ORDER — HYDROXYUREA 500 MG PO CAPS: 500.0000 mg | ORAL_CAPSULE | Freq: Every day | ORAL | 3 refills | Status: DC

## 2021-07-24 NOTE — Assessment & Plan Note (Signed)
Diagnosed at [facility] Current treatment: Hydroxyurea 500 mg daily History of COPD, depression, hypertension, hypercholesterolemia, osteoporosis, GERD, CAD Hydroxyurea toxicities:None: She is tolerating it extremely well.  Lab review: 05/11/2020: Hemoglobin 14.3, WBC 7.9, MCV 106.8, platelets 260 01/03/2021: Hemoglobin 13.7, WBC 7.3, MCV 103.9, platelets 254 06/17/2021: Hemoglobin 15.3, WBC 14.3, platelets 639  MCV appears to be normal and it is suggesting that the patient may not be taking Hydrea as recommended I decreased the dosage of Hydrea to 5 days a week starting 01/03/2021.  Hospitalization for coronary artery disease  I agree with continuing the current treatment. Return to clinic every33month with labs and follow-up.

## 2021-07-26 ENCOUNTER — Ambulatory Visit (HOSPITAL_COMMUNITY): Payer: Medicare HMO

## 2021-07-29 ENCOUNTER — Ambulatory Visit (HOSPITAL_COMMUNITY): Payer: Medicare HMO

## 2021-07-29 ENCOUNTER — Telehealth: Payer: Self-pay | Admitting: Pharmacist

## 2021-07-29 NOTE — Telephone Encounter (Signed)
Called [employer] and confirmed that pt was approved for Tyvasso at $0 copay. [place] to confirm the approval notice and requested pt's son to confirm the delivery date/address   Called regarding Opsumit. Pt was referred over to [facility] for patient assistance. [employer] had incorrect prescriber contact information and thus was never able to be processed. Provider updated contact information for Dr. Virgina Jock. Completed the online [employer] patient application and sent the application to pt's son to have the pt sign and date. Will submit the Opsumit patient assistance documents when returned.

## 2021-07-29 NOTE — Telephone Encounter (Signed)
Thank you for the update. Can you check with Opsumit rep if we can have, perhaps, samples for it?  Thanks MJP

## 2021-07-30 DIAGNOSIS — I5032 Chronic diastolic (congestive) heart failure: Secondary | ICD-10-CM | POA: Diagnosis not present

## 2021-07-30 DIAGNOSIS — J439 Emphysema, unspecified: Secondary | ICD-10-CM | POA: Diagnosis not present

## 2021-07-30 DIAGNOSIS — F32A Depression, unspecified: Secondary | ICD-10-CM | POA: Diagnosis not present

## 2021-07-30 DIAGNOSIS — E785 Hyperlipidemia, unspecified: Secondary | ICD-10-CM | POA: Diagnosis not present

## 2021-07-30 DIAGNOSIS — J9621 Acute and chronic respiratory failure with hypoxia: Secondary | ICD-10-CM | POA: Diagnosis not present

## 2021-07-30 DIAGNOSIS — I110 Hypertensive heart disease with heart failure: Secondary | ICD-10-CM | POA: Diagnosis not present

## 2021-07-30 DIAGNOSIS — K219 Gastro-esophageal reflux disease without esophagitis: Secondary | ICD-10-CM | POA: Diagnosis not present

## 2021-07-30 DIAGNOSIS — I350 Nonrheumatic aortic (valve) stenosis: Secondary | ICD-10-CM | POA: Diagnosis not present

## 2021-07-30 DIAGNOSIS — I2510 Atherosclerotic heart disease of native coronary artery without angina pectoris: Secondary | ICD-10-CM | POA: Diagnosis not present

## 2021-07-31 ENCOUNTER — Ambulatory Visit (HOSPITAL_COMMUNITY): Payer: Medicare HMO

## 2021-08-02 ENCOUNTER — Ambulatory Visit (HOSPITAL_COMMUNITY): Payer: Medicare HMO

## 2021-08-05 ENCOUNTER — Ambulatory Visit (HOSPITAL_COMMUNITY): Payer: Medicare HMO

## 2021-08-07 ENCOUNTER — Ambulatory Visit (HOSPITAL_COMMUNITY): Payer: Medicare HMO

## 2021-08-08 DIAGNOSIS — I350 Nonrheumatic aortic (valve) stenosis: Secondary | ICD-10-CM | POA: Diagnosis not present

## 2021-08-08 DIAGNOSIS — F32A Depression, unspecified: Secondary | ICD-10-CM | POA: Diagnosis not present

## 2021-08-08 DIAGNOSIS — I110 Hypertensive heart disease with heart failure: Secondary | ICD-10-CM | POA: Diagnosis not present

## 2021-08-08 DIAGNOSIS — J9621 Acute and chronic respiratory failure with hypoxia: Secondary | ICD-10-CM | POA: Diagnosis not present

## 2021-08-08 DIAGNOSIS — E785 Hyperlipidemia, unspecified: Secondary | ICD-10-CM | POA: Diagnosis not present

## 2021-08-08 DIAGNOSIS — I2510 Atherosclerotic heart disease of native coronary artery without angina pectoris: Secondary | ICD-10-CM | POA: Diagnosis not present

## 2021-08-08 DIAGNOSIS — K219 Gastro-esophageal reflux disease without esophagitis: Secondary | ICD-10-CM | POA: Diagnosis not present

## 2021-08-08 DIAGNOSIS — I5032 Chronic diastolic (congestive) heart failure: Secondary | ICD-10-CM | POA: Diagnosis not present

## 2021-08-08 DIAGNOSIS — J439 Emphysema, unspecified: Secondary | ICD-10-CM | POA: Diagnosis not present

## 2021-08-09 ENCOUNTER — Ambulatory Visit (HOSPITAL_COMMUNITY): Payer: Medicare HMO

## 2021-08-16 DIAGNOSIS — J9611 Chronic respiratory failure with hypoxia: Secondary | ICD-10-CM | POA: Diagnosis not present

## 2021-08-16 DIAGNOSIS — H04129 Dry eye syndrome of unspecified lacrimal gland: Secondary | ICD-10-CM | POA: Diagnosis not present

## 2021-08-16 DIAGNOSIS — J449 Chronic obstructive pulmonary disease, unspecified: Secondary | ICD-10-CM | POA: Diagnosis not present

## 2021-08-16 DIAGNOSIS — I5032 Chronic diastolic (congestive) heart failure: Secondary | ICD-10-CM | POA: Diagnosis not present

## 2021-08-16 DIAGNOSIS — S0511XA Contusion of eyeball and orbital tissues, right eye, initial encounter: Secondary | ICD-10-CM | POA: Diagnosis not present

## 2021-08-29 ENCOUNTER — Ambulatory Visit: Payer: Medicare HMO | Admitting: Cardiology

## 2021-08-29 ENCOUNTER — Other Ambulatory Visit: Payer: Self-pay

## 2021-08-29 ENCOUNTER — Other Ambulatory Visit: Payer: Self-pay | Admitting: Cardiology

## 2021-08-29 ENCOUNTER — Encounter: Payer: Self-pay | Admitting: Cardiology

## 2021-08-29 VITALS — BP 136/67 | HR 78 | Temp 98.2°F | Resp 17 | Ht 59.0 in | Wt 108.8 lb

## 2021-08-29 DIAGNOSIS — I10 Essential (primary) hypertension: Secondary | ICD-10-CM | POA: Diagnosis not present

## 2021-08-29 DIAGNOSIS — I2721 Secondary pulmonary arterial hypertension: Secondary | ICD-10-CM | POA: Diagnosis not present

## 2021-08-29 DIAGNOSIS — I350 Nonrheumatic aortic (valve) stenosis: Secondary | ICD-10-CM | POA: Diagnosis not present

## 2021-08-29 DIAGNOSIS — R0609 Other forms of dyspnea: Secondary | ICD-10-CM

## 2021-08-29 DIAGNOSIS — I25118 Atherosclerotic heart disease of native coronary artery with other forms of angina pectoris: Secondary | ICD-10-CM | POA: Diagnosis not present

## 2021-08-29 NOTE — Progress Notes (Signed)
Patient referred by [name], MD for coronary artery disease  Subjective:   Melissa Barron, female    DOB: 02-Jun-1931, 87 y.o.   MRN: [id number]   Chief Complaint  Patient presents with   [facility]   Follow-up    4-6 week    87 y.o. Caucasian female with hypertension, hyperlipidemia, CAD, mod AS, COPD, depression, GERD, now with severe PAH (diagnosed 06/2021)  Patient is currently on Opsumit (Macitentan) 10 mg daily and Tyvaso (Treprostinil) 4 breaths 4 times daily.   She is tolerating the treatments fairly well, baring occasional headache, diarrhea, one episode of chest tightness and lip numbness. She felt well initially, but has had difficulty concentrating routine tasks with "brain fog". She has generalized fatigue. She is currently on 4 L O2.  She is awaiting rheumatology consult, scheduled for January 13th. Of note, she is on Hydroxyurea for treatment of PCV, managed by Dr. Lindi Adie.    Current Outpatient Medications on File Prior to Visit  Medication Sig Dispense Refill   amLODipine (NORVASC) 10 MG tablet Take 1 tablet (10 mg total) by mouth daily. 90 tablet 3   aspirin EC 81 MG tablet Take 1 tablet (81 mg total) by mouth daily. Swallow whole. 90 tablet 3   atorvastatin (LIPITOR) 40 MG tablet Take 1 tablet (40 mg total) by mouth daily. 90 tablet 3   bisoprolol (ZEBETA) 5 MG tablet Take 1 tablet (5 mg total) by mouth daily. 90 tablet 3   Cholecalciferol (VITAMIN D3) 250 MCG (10000 UT) capsule Take 1 capsule (10,000 Units total) by mouth daily.     denosumab (PROLIA) 60 MG/ML SOSY injection Inject 60 mg into the skin every 6 (six) months.     furosemide (LASIX) 40 MG tablet Take 0.5 tablets (20 mg total) by mouth 2 (two) times daily. 120 tablet 3   hydroxyurea (HYDREA) 500 MG capsule Take 1 capsule (500 mg total) by mouth daily. 90 capsule 3   irbesartan (AVAPRO) 300 MG tablet Take 1 tablet (300 mg total) by mouth at bedtime. 90 tablet 3   isosorbide mononitrate (IMDUR) 30 MG 24  hr tablet TAKE 1 TABLET (30 MG TOTAL) BY MOUTH DAILY. 90 tablet 1   KLOR-CON M20 20 MEQ tablet TAKE 1 TABLET BY MOUTH EVERY DAY (Patient taking differently: Take 20 mEq by mouth daily.) 90 tablet 1   macitentan (OPSUMIT) 10 MG tablet Take 1 tablet (10 mg total) by mouth daily. 90 tablet 2   nitroGLYCERIN (NITROSTAT) 0.4 MG SL tablet Place 1 tablet (0.4 mg total) under the tongue every 5 (five) minutes x 3 doses as needed for chest pain. 25 tablet 3   nystatin ointment (MYCOSTATIN) Apply 1 application topically 2 (two) times daily as needed (rash).     pantoprazole (PROTONIX) 40 MG tablet TAKE 1 TABLET (40 MG TOTAL) BY MOUTH DAILY. 90 tablet 3   sertraline (ZOLOFT) 100 MG tablet Take 100 mg by mouth daily.     Tiotropium Bromide-Olodaterol (STIOLTO RESPIMAT) 2.5-2.5 MCG/ACT AERS Inhale 2 puffs into the lungs daily. 4 g 3   Treprostinil (TYVASO REFILL) 0.6 MG/ML SOLN Inhale 18 mcg into the lungs 4 (four) times daily. Titrate up by 3 inhalations every 1-2 weeks as tolerated to goal inhalation of 12 per dose. (Patient not taking: Reported on 07/18/2021) 2.9 mL 11   Treprostinil (TYVASO STARTER) 0.6 MG/ML SOLN Inhale 18 mcg into the lungs in the morning, at noon, in the evening, and at bedtime. (Patient not taking: Reported  on 07/18/2021) 2.9 mL 0   No current facility-administered medications on file prior to visit.    Cardiovascular and other pertinent studies:  Echocardiogram 06/19/2021:  Left ventricle cavity is normal in size. Mild concentric hypertrophy of  the left ventricle. Systolic flattening of interventricular septum  suggests RV pressure overload.  Normal LV systolic function with visual EF  65-70%. Doppler evidence of grade I (impaired) diastolic dysfunction,  normal LAP.  Left atrial cavity is moderately dilated. The interatrial Septum is thin  and mobile but appears to be intact by 2D and CF Doppler interrogation.  Septum bulges towards left atrium suggesting RA pressure overload.   Right atrial cavity is moderately dilated.  Right ventricle cavity is moderately dilated. Normal right ventricular  function.  Trileaflet aortic valve with mild aortic valve leaflet calcification.  Moderate aortic stenosis. Vmax 3.5 m/sec, mean PG 27 mmHg, AVA 1.2 cm by  continuity equation. Moderate (Grade II) aortic regurgitation.  Mild mitral valve leaflet calcification. Mild restriction of leaflets  without significant stenosis. Mild (Grade I) mitral regurgitation.  Moderate to severe tricuspid regurgitation. At least moderate pulmonary  hypertension. Estimated pulmonary artery systolic pressure at least 57  mmHg.  Mild pulmonic regurgitation.  IVC is dilated with a respiratory response of <50%. Estimated right atrial  pressure at least 15 mmHg.  Compared to previous study on 01/01/2021, tricuspid regurgitation has  increased from mild to mod-severe, estimated PASP increased from 37 mmHg  to at least 57 mmHg, RA/RV dilatation is new. Aortic valve stenosis  (previously reported as LVOT obstruction) is unchanged.    [place] 06/18/2021: RA: 4 mmHg RV: 76/2 mmHg PA: 75/23 mmHg, mPAP 42 mmHg PCW: 6-7 mmHg (PCW was checked and confirmed in multiple locations-RU,RL,LU)   CO: 2.9 L/min CI: 2.0 L/min/m2   PVR: 12 WU   Moderate pulmonary hypertension, WHO Grp I    Lexiscan (Walking with mod Bruce)Tetrofosmin Stress Test: 06/12/2021: 1 Day Rest/Stress Protocol. Stress EKG is non-diagnostic, as this is pharmacological stress test using Lexiscan. Normal myocardial perfusion without convincing evidence of reversible myocardial ischemia or prior infarct. Left ventricular wall thickness preserved, no regional wall motion abnormalities, calculated LVEF 79%. Low risk study. No prior studies for comparison.  EKG 05/24/2021: Sinus rhythm 56 bpm First degree A-V block  Anteroseptal infarct -age undetermined Nonspecific T wave inversion lead III  Echocardiogram 01/01/2021: Left ventricle  cavity is normal in size. Moderate concentric hypertrophy of the left ventricle. Normal global wall motion. Normal LV systolic function with EF 63%. Doppler evidence of grade I (impaired) diastolic dysfunction, normal LAP.  Left atrial cavity is moderately dilated. Trileaflet aortic valve with moderate calcification. Transvalvular mean PG 28 mmHg, max PG 44 mmHg, likely originating predominantly from LVOT than true valvular stenosis.  Moderate (grade II) aortic regurgitation. Mild tricuspid regurgitation. Estimated pulmonary artery systolic pressure 37 mmHg.  No significant change compared to previous study on 06/26/2020.   Coronary intervention 12/28/2020: LM: Distal 20% stenosis LAD: Tortuous vessel        Prox-mid prior stent with focal 80% ISR        Attempted orbital atherectomy        Shockwave lithotripsy        PTCA and stent placement 3.5 X 12 mm Synergy drug-eluting stent        Intravascular ultrasound (IVUS) MSA 7.1 mm2        80%-->0% residual stenosis        TIMI flow II-->III LCx: Tortuous vessel  Mid focal 40% stenosis RCA: Prox-mid prior stent         Mid 40% ISR         RV marginal branch with prox 90% diffuse disease   LV-Ao Peak-to-peak gradient 21 mmHg LV-Ao Mean PG 20 mmHg Moderate aortic stenosis   Carotid artery duplex 06/26/2020:  Doppler velocity suggests stenosis in the right internal carotid artery  (16-49%). Stenosis in the right external carotid artery (<50%).  Doppler velocity suggests stenosis in the left internal carotid artery  (50-69%). Stenosis in the left external carotid artery (<50%).  Antegrade right vertebral artery flow. Antegrade left vertebral artery  flow.  Follow up in six months is appropriate if clinically indicated.  Recent labs: 06/10/2021: Glucose 91, BUN/Cr 19/1.01. EGFR 54. Na/K 142/4.5.  BNP 849 H/H 16/49. MCV 88. Platelets 631  02/14/2021: BNP 790 high  01/03/2021: H/H 13.7/42.4. MCV 103.9. Platelets  254  05/12/2020: Glucose 80, BUN/Cr 17/0.7. EGFR 79. Na/K 137/4.1. Rest of the CMP normal H/H 14/44. MCV 106. Platelets 280 HbA1C N/A Chol 124, TG 106, HDL 42, LDL 61 TSH N/A    Review of Systems  Constitutional: Positive for malaise/fatigue.  Cardiovascular:  Positive for chest pain and dyspnea on exertion (Worsening). Negative for leg swelling, palpitations and syncope.  Respiratory:  Positive for shortness of breath (Worsening).   Neurological:  Positive for difficulty with concentration.        Vitals:   08/29/21 1210  BP: 136/67  Pulse: 78  Resp: 17  Temp: 98.2 F (36.8 C)  SpO2: (!) 84%    Body mass index is 21.97 kg/m. Filed Weights   08/29/21 1210  Weight: 108 lb 12.8 oz (49.4 kg)      Objective:   Physical Exam Vitals and nursing note reviewed.  Constitutional:      General: She is not in acute distress.    Appearance: She is underweight.     Comments: On supplemental oxygen  Neck:     Vascular: No JVD.  Cardiovascular:     Rate and Rhythm: Normal rate and regular rhythm.     Pulses:          Carotid pulses are  on the right side with bruit.    Heart sounds: Murmur heard.  Pulmonary:     Effort: Pulmonary effort is normal.     Breath sounds: No wheezing or rales.  Musculoskeletal:     Right lower leg: Edema (2+) present.     Left lower leg: Edema (2+) present.        Assessment & Recommendations:   87 y.o. Caucasian female with hypertension, hyperlipidemia, CAD, COPD, depression, GERD, now with acute worsening of exertional dyspnea, new diagnosis pulmonary hypertension  Pulmonary hypertension: Predominantly WHO group 1, FC III REVEAL 2.0 score 13  High risk (06/2021) 6 min walk distance 160 m (06/2021) on 4 L O2 Elevated ANA titer. Will obtain rheumatology consult for CTD workup and management VQ scan pending  I will check with Dr. Lindi Adie regarding the possibility of polycythemia vera as etiology for her PAH, and any potential  treatment implications.  Continue Opsumit (Macitentan) 10 mg daily and Tyvaso (inhaled treprostinil), currently 4 breaths 4 times daily.  Continue lasix.   I anticipate that she will continue to feel unwell for a while before showing signs of improvement from current [place]. I reckon her concentration difficulty is due to hytpoxia. I have encouraged her to increase her O2 to 5 L/min.  CAD: History of LAD  restenosis, last PCI 11/2020. Stop plavix. Continue Aspirin 81 mg daily Refilled Lipitor at 40 mg daily.  Aortic stenosis, aortic regurgitation: Moderate.  Continue serial echocardiogram monitoring  Hypertension: Controlled.    F/u in 4 weeks   Nigel Mormon, MD Pager: [phone] Office: [phone]

## 2021-08-31 ENCOUNTER — Encounter: Payer: Self-pay | Admitting: Cardiology

## 2021-09-10 ENCOUNTER — Other Ambulatory Visit: Payer: Self-pay | Admitting: Cardiology

## 2021-09-10 ENCOUNTER — Ambulatory Visit (HOSPITAL_COMMUNITY)
Admission: RE | Admit: 2021-09-10 | Discharge: 2021-09-10 | Disposition: A | Discharge status: Home / Self Care | Payer: Medicare HMO | Source: Ambulatory Visit | Attending: Cardiology | Admitting: Cardiology

## 2021-09-10 ENCOUNTER — Other Ambulatory Visit: Payer: Self-pay

## 2021-09-10 DIAGNOSIS — R0602 Shortness of breath: Secondary | ICD-10-CM | POA: Diagnosis not present

## 2021-09-10 DIAGNOSIS — I10 Essential (primary) hypertension: Secondary | ICD-10-CM | POA: Diagnosis not present

## 2021-09-10 DIAGNOSIS — I2721 Secondary pulmonary arterial hypertension: Secondary | ICD-10-CM | POA: Diagnosis not present

## 2021-09-10 DIAGNOSIS — R0609 Other forms of dyspnea: Secondary | ICD-10-CM | POA: Insufficient documentation

## 2021-09-10 DIAGNOSIS — J439 Emphysema, unspecified: Secondary | ICD-10-CM | POA: Diagnosis not present

## 2021-09-10 DIAGNOSIS — J9690 Respiratory failure, unspecified, unspecified whether with hypoxia or hypercapnia: Secondary | ICD-10-CM | POA: Diagnosis not present

## 2021-09-10 MED ORDER — TECHNETIUM TO 99M ALBUMIN AGGREGATED
4.9000 | Freq: Once | INTRAVENOUS | Status: AC | PRN
  Administered 2021-09-10: 4.9 via INTRAVENOUS

## 2021-09-13 ENCOUNTER — Ambulatory Visit: Payer: Medicare HMO | Admitting: Internal Medicine

## 2021-09-19 ENCOUNTER — Other Ambulatory Visit: Payer: Medicare HMO

## 2021-09-25 ENCOUNTER — Other Ambulatory Visit: Payer: Self-pay

## 2021-09-25 ENCOUNTER — Ambulatory Visit: Payer: Medicare HMO | Admitting: Cardiology

## 2021-09-25 ENCOUNTER — Encounter: Payer: Self-pay | Admitting: Cardiology

## 2021-09-25 VITALS — BP 135/48 | HR 63 | Temp 98.0°F | Resp 16 | Ht 59.0 in | Wt 111.0 lb

## 2021-09-25 DIAGNOSIS — I10 Essential (primary) hypertension: Secondary | ICD-10-CM

## 2021-09-25 DIAGNOSIS — I350 Nonrheumatic aortic (valve) stenosis: Secondary | ICD-10-CM

## 2021-09-25 DIAGNOSIS — I25118 Atherosclerotic heart disease of native coronary artery with other forms of angina pectoris: Secondary | ICD-10-CM | POA: Diagnosis not present

## 2021-09-25 DIAGNOSIS — I272 Pulmonary hypertension, unspecified: Secondary | ICD-10-CM | POA: Diagnosis not present

## 2021-09-25 DIAGNOSIS — I2721 Secondary pulmonary arterial hypertension: Secondary | ICD-10-CM

## 2021-09-25 NOTE — Progress Notes (Signed)
Patient referred by [name], MD for coronary artery disease  Subjective:   Melissa Barron, female    DOB: 1930-10-02, 88 y.o.   MRN: [id number]   Chief Complaint  Patient presents with   [facility]   Follow-up    4 week   Results    88 y.o. Caucasian female with hypertension, hyperlipidemia, CAD, mod AS, COPD, depression, GERD, now with severe PAH (diagnosed 06/2021)  Patient is currently on Opsumit (Macitentan) 10 mg daily and Tyvaso (Treprostinil) 4 breaths 4 times daily.   She is tolerating the treatments fairly well. She does report that she forgets to use her Tyvaso at times. She has felt "great' in last 2-3 days and has been able to do more with walking. She has been able to go to grocery stores, Her current oxygen concentrator only allows for about 1 hour of battey life when used at 5 L/min. This limits her ability to do things outdoors.   As she continues to feel better, she asks me if she could do some volunteering.   On a separate note, VQ scan was negative. She has upcoming rheumatology appt, but wants to skip it.    Current Outpatient Medications on File Prior to Visit  Medication Sig Dispense Refill   amLODipine (NORVASC) 10 MG tablet Take 1 tablet (10 mg total) by mouth daily. 90 tablet 3   aspirin EC 81 MG tablet Take 1 tablet (81 mg total) by mouth daily. Swallow whole. 90 tablet 3   atorvastatin (LIPITOR) 40 MG tablet Take 1 tablet (40 mg total) by mouth daily. 90 tablet 3   bisoprolol (ZEBETA) 5 MG tablet Take 1 tablet (5 mg total) by mouth daily. 90 tablet 3   Cholecalciferol (VITAMIN D3) 250 MCG (10000 UT) capsule Take 1 capsule (10,000 Units total) by mouth daily.     denosumab (PROLIA) 60 MG/ML SOSY injection Inject 60 mg into the skin every 6 (six) months.     furosemide (LASIX) 40 MG tablet Take 0.5 tablets (20 mg total) by mouth 2 (two) times daily. 120 tablet 3   hydroxyurea (HYDREA) 500 MG capsule Take 1 capsule (500 mg total) by mouth daily. 90 capsule 3    irbesartan (AVAPRO) 300 MG tablet Take 1 tablet (300 mg total) by mouth at bedtime. 90 tablet 3   isosorbide mononitrate (IMDUR) 30 MG 24 hr tablet TAKE 1 TABLET (30 MG TOTAL) BY MOUTH DAILY. 90 tablet 1   KLOR-CON M20 20 MEQ tablet TAKE 1 TABLET BY MOUTH EVERY DAY (Patient not taking: Reported on 08/29/2021) 90 tablet 1   macitentan (OPSUMIT) 10 MG tablet Take 1 tablet (10 mg total) by mouth daily. 90 tablet 2   nitroGLYCERIN (NITROSTAT) 0.4 MG SL tablet Place 1 tablet (0.4 mg total) under the tongue every 5 (five) minutes x 3 doses as needed for chest pain. 25 tablet 3   nystatin ointment (MYCOSTATIN) Apply 1 application topically 2 (two) times daily as needed (rash).     pantoprazole (PROTONIX) 40 MG tablet TAKE 1 TABLET (40 MG TOTAL) BY MOUTH DAILY. 90 tablet 3   sertraline (ZOLOFT) 100 MG tablet Take 100 mg by mouth daily.     Tiotropium Bromide-Olodaterol (STIOLTO RESPIMAT) 2.5-2.5 MCG/ACT AERS Inhale 2 puffs into the lungs daily. (Patient taking differently: Inhale 4 puffs into the lungs daily.) 4 g 3   Treprostinil (TYVASO REFILL) 0.6 MG/ML SOLN Inhale 18 mcg into the lungs 4 (four) times daily. Titrate up by 3 inhalations  every 1-2 weeks as tolerated to goal inhalation of 12 per dose. 2.9 mL 11   No current facility-administered medications on file prior to visit.    Cardiovascular and other pertinent studies:  VQ scan 09/10/2021: Very low probability for pulmonary embolism.  Echocardiogram 06/19/2021:  Left ventricle cavity is normal in size. Mild concentric hypertrophy of  the left ventricle. Systolic flattening of interventricular septum  suggests RV pressure overload.  Normal LV systolic function with visual EF  65-70%. Doppler evidence of grade I (impaired) diastolic dysfunction,  normal LAP.  Left atrial cavity is moderately dilated. The interatrial Septum is thin  and mobile but appears to be intact by 2D and CF Doppler interrogation.  Septum bulges towards left atrium  suggesting RA pressure overload.  Right atrial cavity is moderately dilated.  Right ventricle cavity is moderately dilated. Normal right ventricular  function.  Trileaflet aortic valve with mild aortic valve leaflet calcification.  Moderate aortic stenosis. Vmax 3.5 m/sec, mean PG 27 mmHg, AVA 1.2 cm by  continuity equation. Moderate (Grade II) aortic regurgitation.  Mild mitral valve leaflet calcification. Mild restriction of leaflets  without significant stenosis. Mild (Grade I) mitral regurgitation.  Moderate to severe tricuspid regurgitation. At least moderate pulmonary  hypertension. Estimated pulmonary artery systolic pressure at least 57  mmHg.  Mild pulmonic regurgitation.  IVC is dilated with a respiratory response of <50%. Estimated right atrial  pressure at least 15 mmHg.  Compared to previous study on 01/01/2021, tricuspid regurgitation has  increased from mild to mod-severe, estimated PASP increased from 37 mmHg  to at least 57 mmHg, RA/RV dilatation is new. Aortic valve stenosis  (previously reported as LVOT obstruction) is unchanged.    [place] 06/18/2021: RA: 4 mmHg RV: 76/2 mmHg PA: 75/23 mmHg, mPAP 42 mmHg PCW: 6-7 mmHg (PCW was checked and confirmed in multiple locations-RU,RL,LU)   CO: 2.9 L/min CI: 2.0 L/min/m2   PVR: 12 WU   Moderate pulmonary hypertension, WHO Grp I    Lexiscan (Walking with mod Bruce)Tetrofosmin Stress Test: 06/12/2021: 1 Day Rest/Stress Protocol. Stress EKG is non-diagnostic, as this is pharmacological stress test using Lexiscan. Normal myocardial perfusion without convincing evidence of reversible myocardial ischemia or prior infarct. Left ventricular wall thickness preserved, no regional wall motion abnormalities, calculated LVEF 79%. Low risk study. No prior studies for comparison.  EKG 05/24/2021: Sinus rhythm 56 bpm First degree A-V block  Anteroseptal infarct -age undetermined Nonspecific T wave inversion lead  III  Echocardiogram 01/01/2021: Left ventricle cavity is normal in size. Moderate concentric hypertrophy of the left ventricle. Normal global wall motion. Normal LV systolic function with EF 63%. Doppler evidence of grade I (impaired) diastolic dysfunction, normal LAP.  Left atrial cavity is moderately dilated. Trileaflet aortic valve with moderate calcification. Transvalvular mean PG 28 mmHg, max PG 44 mmHg, likely originating predominantly from LVOT than true valvular stenosis.  Moderate (grade II) aortic regurgitation. Mild tricuspid regurgitation. Estimated pulmonary artery systolic pressure 37 mmHg.  No significant change compared to previous study on 06/26/2020.   Coronary intervention 12/28/2020: LM: Distal 20% stenosis LAD: Tortuous vessel        Prox-mid prior stent with focal 80% ISR        Attempted orbital atherectomy        Shockwave lithotripsy        PTCA and stent placement 3.5 X 12 mm Synergy drug-eluting stent        Intravascular ultrasound (IVUS) MSA 7.1 mm2  80%-->0% residual stenosis        TIMI flow II-->III LCx: Tortuous vessel          Mid focal 40% stenosis RCA: Prox-mid prior stent         Mid 40% ISR         RV marginal branch with prox 90% diffuse disease   LV-Ao Peak-to-peak gradient 21 mmHg LV-Ao Mean PG 20 mmHg Moderate aortic stenosis   Carotid artery duplex 06/26/2020:  Doppler velocity suggests stenosis in the right internal carotid artery  (16-49%). Stenosis in the right external carotid artery (<50%).  Doppler velocity suggests stenosis in the left internal carotid artery  (50-69%). Stenosis in the left external carotid artery (<50%).  Antegrade right vertebral artery flow. Antegrade left vertebral artery  flow.  Follow up in six months is appropriate if clinically indicated.  Recent labs: 06/10/2021: Glucose 91, BUN/Cr 19/1.01. EGFR 54. Na/K 142/4.5.  BNP 849 H/H 16/49. MCV 88. Platelets 631  02/14/2021: BNP 790  high  01/03/2021: H/H 13.7/42.4. MCV 103.9. Platelets 254  05/12/2020: Glucose 80, BUN/Cr 17/0.7. EGFR 79. Na/K 137/4.1. Rest of the CMP normal H/H 14/44. MCV 106. Platelets 280 HbA1C N/A Chol 124, TG 106, HDL 42, LDL 61 TSH N/A    Review of Systems  Constitutional: Positive for malaise/fatigue.  Cardiovascular:  Positive for chest pain and dyspnea on exertion (Improving). Negative for leg swelling, palpitations and syncope.  Respiratory:  Positive for shortness of breath (Improving).   Neurological:  Positive for difficulty with concentration.        Vitals:   09/25/21 1453  BP: (!) 135/48  Pulse: 63  Resp: 16  Temp: 98 F (36.7 C)  SpO2: 91%    Body mass index is 22.42 kg/m. Filed Weights   09/25/21 1453  Weight: 111 lb (50.3 kg)    Six Minute Walk - 09/25/21 1533       Six Minute Walk   Supplemental oxygen during test? Yes    O2 Flow Rate (L/min) 4 L/min    Lap distance in meters  20 meters    Laps Completed  11    Baseline Heartrate 60    Baseline SPO2 97 %      Interval Oxygen Saturation and HR    2 Minute Oxygen Saturation % 92 %    2 Minute HR 74    4 Minute Oxygen Saturation % 84 %    4 Minute HR 77    6 Minute Oxygen Saturation % 73 %    6 Minute HR 79      2 Minutes Post Walk Values   Stopped or paused before six minutes? Yes    Other Symptoms at end of exercise: Dizziness;Leg pain            Total distance: 220 m    Objective:   Physical Exam Vitals and nursing note reviewed.  Constitutional:      General: She is not in acute distress.    Appearance: She is underweight.     Comments: On supplemental oxygen  Neck:     Vascular: No JVD.  Cardiovascular:     Rate and Rhythm: Normal rate and regular rhythm.     Pulses:          Carotid pulses are  on the right side with bruit.    Heart sounds: Murmur heard.  Pulmonary:     Effort: Pulmonary effort is normal.     Breath sounds: No wheezing or  rales.  Musculoskeletal:      Right lower leg: Edema (1+) present.     Left lower leg: Edema (1+) present.        Assessment & Recommendations:   88 y.o. Caucasian female with hypertension, hyperlipidemia, CAD, COPD, depression, GERD, now with acute worsening of exertional dyspnea, new diagnosis pulmonary hypertension  Pulmonary hypertension: Predominantly WHO group 1, FC III REVEAL 2.0 score 13  High risk (06/2021) 6 min walk distance 220 m on 4 L O2 (09/2021), up from 160 m on 4 L O2 (06/2021) She did get hypoxic to 73% while walking briskly at 6 min, but came back to 99% rapidly.  Elevated ANA titer, could be nonspecific. VQ scan very low probability for CTEPH  Patient does not want to use cylinder, but is going to ask [employer] [facility] if they would provide her additional batteries.   I will check with Dr. Lindi Adie regarding the possibility of polycythemia vera as etiology for her PAH, and any potential treatment implications.  Continue Opsumit (Macitentan) 10 mg daily and Tyvaso (inhaled treprostinil), currently 4 breaths 4 times daily.  Continue lasix.   CAD: History of LAD restenosis, last PCI 11/2020. Stop plavix. Continue Aspirin 81 mg daily Refilled Lipitor at 40 mg daily.  Aortic stenosis, aortic regurgitation: Moderate.  Continue serial echocardiogram monitoring  Hypertension: Controlled.   F/u in 6 weeks   Nigel Mormon, MD Pager: [phone] Office: [phone]

## 2021-09-26 ENCOUNTER — Ambulatory Visit: Payer: Medicare HMO | Admitting: Internal Medicine

## 2021-09-26 ENCOUNTER — Telehealth: Payer: Self-pay | Admitting: Cardiology

## 2021-09-26 ENCOUNTER — Other Ambulatory Visit: Payer: Self-pay | Admitting: Cardiology

## 2021-09-26 DIAGNOSIS — I2721 Secondary pulmonary arterial hypertension: Secondary | ICD-10-CM

## 2021-09-26 NOTE — Telephone Encounter (Signed)
Sure

## 2021-09-28 ENCOUNTER — Other Ambulatory Visit: Payer: Self-pay | Admitting: Pulmonary Disease

## 2021-10-01 ENCOUNTER — Telehealth: Payer: Self-pay

## 2021-10-14 NOTE — Progress Notes (Signed)
Office Visit Note  Patient: Melissa Barron             Date of Birth: Jun 01, 1931           MRN: [id number]             PCP: [name], MD Referring: Nigel Mormon, MD Visit Date: 10/15/2021  Subjective:  New Patient (Initial Visit) (R/O from Cardiologist )   History of Present Illness: Melissa Barron is a 88 y.o. female here for evaluation of positive ANA associated with pulmonary arterial hypertension. She has a history of CAD, mild or moderate COPD, PCV on hydroxyurea, and most recently found to have significant pulmonary hypertension. Out of proportion to her COPD and interstitial lung changes. These are chronic and past symptoms attributed to airspace disease until more recently. Lab workup revealed a positive ANA without abnormal inflammatory markers. Smith antibody low positive 1.3. V/Q scan was very low probability for chronic pulmonary artery thrombosis. She is on multiple medications including tyvaso and macitentan in addition to COPD inhaler medications. She has not noticed major recent health changes besides from her pulmonary disease. She has dry nose and mouth worse with 5 Lpm supplemental oxygen had some nosebleeds but no ulcers. She denies cervical or axillary lymphadenopathy. She has some dry skin and skin fragility without new rashes. No raynauds phemonenon. She does report inability to measure pulse oximetry on her fingers and toes much of the time. She has bilateral left worse than right pedal edema limited to just the foot and ankle. She denies any history of blood clots or abnormal bleeding. Extensive workup has never demonstrated inflammatory cardiac changes. Blood counts are persistently high due to PCV.  Her son is present today helping with transportation and providing collateral history.  Labs reviewed 07/2021 ANA 1:160 homogenous 1:80 speckled SM 1.3 dsDNA, RNP, Scl-70, SSA, SSB neg ESR 2 CRP 2 CBC WBC 13.9 Plts 636  Activities of Daily Living:  Patient  reports morning stiffness for 0  none .   Patient Denies nocturnal pain.  Difficulty dressing/grooming: Denies Difficulty climbing stairs: Denies Difficulty getting out of chair: Denies Difficulty using hands for taps, buttons, cutlery, and/or writing: Denies  Review of Systems  Constitutional:  Positive for fatigue.  HENT:  Positive for mouth dryness.   Eyes:  Positive for dryness.  Respiratory:  Positive for shortness of breath.   Cardiovascular:  Positive for swelling in legs/feet.  Gastrointestinal:  Negative for constipation.  Endocrine: Negative for cold intolerance.  Genitourinary:  Negative for difficulty urinating.  Musculoskeletal:  Negative for joint pain and joint pain.  Skin:  Negative for rash.  Allergic/Immunologic: Negative for susceptible to infections.  Neurological:  Negative for numbness.  Hematological:  Negative for bruising/bleeding tendency.  Psychiatric/Behavioral:  Negative for sleep disturbance.    PMFS History:  Patient Active Problem List   Diagnosis Date Noted   Positive ANA (antinuclear antibody) 10/15/2021   PAH (pulmonary artery hypertension) ([place]) 07/18/2021   Respiratory failure ([place]) 06/18/2021   Pulmonary hypertension ([place])    Acute on chronic respiratory failure with hypoxia ([place]) 06/17/2021   Chronic obstructive pulmonary disease ([place]) 06/17/2021   Chronic heart failure with preserved ejection fraction ([place]) 03/13/2021   Unstable angina ([place]) 12/27/2020   Exertional dyspnea 12/26/2020   Nonrheumatic aortic valve stenosis 12/26/2020   Polycythemia vera ([place]) 07/06/2020   Coronary artery disease 06/18/2020   Bruit of right carotid artery 06/18/2020   Family history of abdominal aortic aneurysm (AAA) 06/18/2020  Essential hypertension 06/18/2020    Past Medical History:  Diagnosis Date   Coronary artery disease    Emphysema, unspecified ([place])    Both   Heart attack ([place]) 2016   Hyperlipidemia    Hypertension     Family History   Problem Relation Age of Onset   Heart attack Mother    Parkinson's disease Sister    Emphysema Sister    Colon cancer Brother    Past Surgical History:  Procedure Laterality Date   CARDIAC CATHETERIZATION     CORONARY ATHERECTOMY N/A 12/28/2020   Procedure: CORONARY ATHERECTOMY;  Surgeon: Nigel Mormon, MD;  Location: [facility];  Service: Cardiovascular;  Laterality: N/A;   INTRAVASCULAR PRESSURE WIRE/FFR STUDY N/A 12/28/2020   Procedure: INTRAVASCULAR PRESSURE WIRE/FFR STUDY;  Surgeon: Nigel Mormon, MD;  Location: [facility];  Service: Cardiovascular;  Laterality: N/A;   LEFT HEART CATH AND CORONARY ANGIOGRAPHY N/A 12/28/2020   Procedure: LEFT HEART CATH AND CORONARY ANGIOGRAPHY;  Surgeon: Nigel Mormon, MD;  Location: [facility];  Service: Cardiovascular;  Laterality: N/A;   RIGHT HEART CATH N/A 06/18/2021   Procedure: RIGHT HEART CATH;  Surgeon: Nigel Mormon, MD;  Location: [facility];  Service: Cardiovascular;  Laterality: N/A;   TOTAL ABDOMINAL HYSTERECTOMY W/ BILATERAL SALPINGOOPHORECTOMY  1984   Social History   Social History Narrative   Not on file   Immunization History  Administered Date(s) Administered   Influenza, Quadrivalent, Recombinant, Inj, Pf 05/11/2020   PFIZER(Purple Top)SARS-COV-2 Vaccination 09/18/2019, 10/09/2019, 07/27/2020   Pneumococcal Conjugate-13 06/01/2015     Objective: Vital Signs: BP (!) 116/48 (BP Location: Right Arm, Patient Position: Sitting, Cuff Size: Normal)    Pulse 60    Resp 20    Ht 5' (1.524 m)    Wt 107 lb (48.5 kg)    BMI 20.90 kg/m    Physical Exam HENT:     Right Ear: External ear normal.     Left Ear: External ear normal.     Nose: Nose normal.     Mouth/Throat:     Mouth: Mucous membranes are dry.     Pharynx: Oropharynx is clear.     Comments: [place] Cardiovascular:     Rate and Rhythm: Normal rate and regular rhythm.     Heart sounds: Murmur heard.   Pulmonary:     Effort: Pulmonary effort is normal.     Comments: On supplemental oxygen by nasal cannula Bilateral inspiratory crackles in lung bases Skin:    General: Skin is warm and dry.     Findings: No rash.     Comments: Numerous solar lentigines Few bruises on forearm extensor surfaces Normal appearing nailfold capillaroscopy No digital pitting  Neurological:     General: No focal deficit present.     Mental Status: She is alert.  Psychiatric:        Mood and Affect: Mood normal.     Musculoskeletal Exam:  Shoulders full ROM no tenderness or swelling Elbows full ROM no tenderness or swelling Wrists full ROM no tenderness or swelling Fingers full ROM, chronic OA changes of bilateral thumbs, full ROM intact no palpable synovitis Knees full ROM no tenderness or swelling Ankles full ROM no tenderness, overlying mild pitting edema worse on left side without dermatitis or other skin changes MTPs full ROM no tenderness or swelling   Investigation: No additional findings.  Imaging: No results found.  Recent Labs: Lab Results  Component  Value Date   WBC 13.9 (H) 07/24/2021   HGB 14.2 07/24/2021   PLT 636 (H) 07/24/2021   NA 142 06/18/2021   NA 143 06/18/2021   K 3.8 06/18/2021   K 3.7 06/18/2021   CL 108 06/17/2021   CO2 27 06/17/2021   GLUCOSE 96 06/17/2021   BUN 20 06/17/2021   CREATININE 1.08 (H) 06/17/2021   BILITOT 1.0 06/17/2021   ALKPHOS 56 06/17/2021   AST 24 06/17/2021   ALT 25 06/17/2021   PROT 5.9 (L) 06/17/2021   ALBUMIN 3.0 (L) 06/17/2021   CALCIUM 8.6 (L) 06/17/2021    Speciality Comments: No specialty comments available.  Procedures:  No procedures performed Allergies: Latex   Assessment / Plan:     Visit Diagnoses: PAH (pulmonary artery hypertension) (HCC) Positive ANA (antinuclear antibody)  Low positive disease specific antibody titer but does not meet criteria of SLE by clinical criteria. I do not see any systemic inflammatory  changes on exam or from review of imaging studies. Isolated vascular involvement seems very unlikely with no abnormal findings on CT imaging, normal ESR and CRP, and grossly normal peripheral perfusion. I discussed possible testing for serum complements or immunoglobulins, but I would not recommend systemic immunosuppression based on an abnormal result so low value. No follow up needed new concerns or unless clinical picture changes.  Orders: No orders of the defined types were placed in this encounter.  No orders of the defined types were placed in this encounter.    Follow-Up Instructions: No follow-ups on file.   Collier Salina, MD  Note - This record has been created using [employer].  Chart creation errors have been sought, but may not always  have been located. Such creation errors do not reflect on  the standard of medical care.

## 2021-10-15 ENCOUNTER — Encounter: Payer: Self-pay | Admitting: Internal Medicine

## 2021-10-15 ENCOUNTER — Other Ambulatory Visit: Payer: Self-pay

## 2021-10-15 ENCOUNTER — Ambulatory Visit: Payer: Medicare HMO | Admitting: Internal Medicine

## 2021-10-15 VITALS — BP 116/48 | HR 60 | Resp 20 | Ht 60.0 in | Wt 107.0 lb

## 2021-10-15 DIAGNOSIS — I2721 Secondary pulmonary arterial hypertension: Secondary | ICD-10-CM

## 2021-10-15 DIAGNOSIS — R768 Other specified abnormal immunological findings in serum: Secondary | ICD-10-CM

## 2021-10-17 ENCOUNTER — Other Ambulatory Visit: Payer: Self-pay

## 2021-10-17 MED ORDER — ATORVASTATIN CALCIUM 40 MG PO TABS: 40.0000 mg | ORAL_TABLET | Freq: Every day | ORAL | 3 refills | Status: AC

## 2021-10-20 ENCOUNTER — Other Ambulatory Visit: Payer: Self-pay | Admitting: Cardiology

## 2021-10-23 ENCOUNTER — Other Ambulatory Visit: Payer: Medicare HMO

## 2021-10-23 ENCOUNTER — Other Ambulatory Visit: Payer: Self-pay

## 2021-10-23 ENCOUNTER — Ambulatory Visit: Payer: Medicare HMO

## 2021-10-23 DIAGNOSIS — I2721 Secondary pulmonary arterial hypertension: Secondary | ICD-10-CM | POA: Diagnosis not present

## 2021-10-25 NOTE — Progress Notes (Signed)
Reviewed the echocardiogram findings with patient's son. Remarkable improvement and near resolution of pulmonary hypertension and RV dilatation. Aortic stenosis will need follow up. Will see her in April with repeat 6 min walk test.  Nigel Mormon, MD

## 2021-10-29 DIAGNOSIS — I110 Hypertensive heart disease with heart failure: Secondary | ICD-10-CM | POA: Diagnosis not present

## 2021-10-29 DIAGNOSIS — I5032 Chronic diastolic (congestive) heart failure: Secondary | ICD-10-CM | POA: Diagnosis not present

## 2021-10-29 DIAGNOSIS — E7800 Pure hypercholesterolemia, unspecified: Secondary | ICD-10-CM | POA: Diagnosis not present

## 2021-10-29 DIAGNOSIS — J449 Chronic obstructive pulmonary disease, unspecified: Secondary | ICD-10-CM | POA: Diagnosis not present

## 2021-11-05 DIAGNOSIS — E559 Vitamin D deficiency, unspecified: Secondary | ICD-10-CM | POA: Diagnosis not present

## 2021-11-05 DIAGNOSIS — I10 Essential (primary) hypertension: Secondary | ICD-10-CM | POA: Diagnosis not present

## 2021-11-05 DIAGNOSIS — E7800 Pure hypercholesterolemia, unspecified: Secondary | ICD-10-CM | POA: Diagnosis not present

## 2021-11-29 DIAGNOSIS — E7800 Pure hypercholesterolemia, unspecified: Secondary | ICD-10-CM | POA: Diagnosis not present

## 2021-11-29 DIAGNOSIS — I5032 Chronic diastolic (congestive) heart failure: Secondary | ICD-10-CM | POA: Diagnosis not present

## 2021-11-29 DIAGNOSIS — J449 Chronic obstructive pulmonary disease, unspecified: Secondary | ICD-10-CM | POA: Diagnosis not present

## 2021-11-29 DIAGNOSIS — I110 Hypertensive heart disease with heart failure: Secondary | ICD-10-CM | POA: Diagnosis not present

## 2021-12-04 ENCOUNTER — Other Ambulatory Visit: Payer: Self-pay | Admitting: Cardiology

## 2021-12-05 ENCOUNTER — Other Ambulatory Visit (HOSPITAL_BASED_OUTPATIENT_CLINIC_OR_DEPARTMENT_OTHER): Payer: Self-pay

## 2021-12-05 ENCOUNTER — Other Ambulatory Visit: Payer: Self-pay

## 2021-12-05 ENCOUNTER — Encounter (HOSPITAL_BASED_OUTPATIENT_CLINIC_OR_DEPARTMENT_OTHER): Payer: Self-pay | Admitting: Emergency Medicine

## 2021-12-05 ENCOUNTER — Emergency Department (HOSPITAL_BASED_OUTPATIENT_CLINIC_OR_DEPARTMENT_OTHER): Payer: Medicare HMO

## 2021-12-05 ENCOUNTER — Emergency Department (HOSPITAL_BASED_OUTPATIENT_CLINIC_OR_DEPARTMENT_OTHER)
Admission: EM | Admit: 2021-12-05 | Discharge: 2021-12-05 | Disposition: A | Discharge status: Home / Self Care | Payer: Medicare HMO | Attending: Emergency Medicine | Admitting: Emergency Medicine

## 2021-12-05 DIAGNOSIS — Z20822 Contact with and (suspected) exposure to covid-19: Secondary | ICD-10-CM | POA: Insufficient documentation

## 2021-12-05 DIAGNOSIS — W19XXXA Unspecified fall, initial encounter: Secondary | ICD-10-CM | POA: Insufficient documentation

## 2021-12-05 DIAGNOSIS — I700 Atherosclerosis of aorta: Secondary | ICD-10-CM | POA: Insufficient documentation

## 2021-12-05 DIAGNOSIS — S299XXA Unspecified injury of thorax, initial encounter: Secondary | ICD-10-CM | POA: Diagnosis present

## 2021-12-05 DIAGNOSIS — J449 Chronic obstructive pulmonary disease, unspecified: Secondary | ICD-10-CM | POA: Diagnosis not present

## 2021-12-05 DIAGNOSIS — Z79899 Other long term (current) drug therapy: Secondary | ICD-10-CM | POA: Insufficient documentation

## 2021-12-05 DIAGNOSIS — I10 Essential (primary) hypertension: Secondary | ICD-10-CM | POA: Insufficient documentation

## 2021-12-05 DIAGNOSIS — K449 Diaphragmatic hernia without obstruction or gangrene: Secondary | ICD-10-CM | POA: Insufficient documentation

## 2021-12-05 DIAGNOSIS — Z91040 Latex allergy status: Secondary | ICD-10-CM | POA: Diagnosis not present

## 2021-12-05 DIAGNOSIS — S2241XA Multiple fractures of ribs, right side, initial encounter for closed fracture: Secondary | ICD-10-CM | POA: Insufficient documentation

## 2021-12-05 DIAGNOSIS — I2510 Atherosclerotic heart disease of native coronary artery without angina pectoris: Secondary | ICD-10-CM | POA: Diagnosis not present

## 2021-12-05 DIAGNOSIS — Z7982 Long term (current) use of aspirin: Secondary | ICD-10-CM | POA: Diagnosis not present

## 2021-12-05 DIAGNOSIS — N200 Calculus of kidney: Secondary | ICD-10-CM | POA: Diagnosis not present

## 2021-12-05 DIAGNOSIS — M545 Low back pain, unspecified: Secondary | ICD-10-CM | POA: Diagnosis not present

## 2021-12-05 DIAGNOSIS — Z043 Encounter for examination and observation following other accident: Secondary | ICD-10-CM | POA: Diagnosis not present

## 2021-12-05 DIAGNOSIS — M2578 Osteophyte, vertebrae: Secondary | ICD-10-CM | POA: Diagnosis not present

## 2021-12-05 LAB — COMPREHENSIVE METABOLIC PANEL
ALT: 9 U/L (ref 0–44)
AST: 13 U/L — ABNORMAL LOW (ref 15–41)
Albumin: 3.5 g/dL (ref 3.5–5.0)
Alkaline Phosphatase: 43 U/L (ref 38–126)
Anion gap: 9 (ref 5–15)
BUN: 26 mg/dL — ABNORMAL HIGH (ref 8–23)
CO2: 24 mmol/L (ref 22–32)
Calcium: 8.5 mg/dL — ABNORMAL LOW (ref 8.9–10.3)
Chloride: 105 mmol/L (ref 98–111)
Creatinine, Ser: 0.89 mg/dL (ref 0.44–1.00)
GFR, Estimated: >60 mL/min (ref >60)
Glucose, Bld: 129 mg/dL — ABNORMAL HIGH (ref 70–99)
Potassium: 3.6 mmol/L (ref 3.5–5.1)
Sodium: 138 mmol/L (ref 135–145)
Total Bilirubin: 0.7 mg/dL (ref 0.3–1.2)
Total Protein: 6 g/dL — ABNORMAL LOW (ref 6.5–8.1)

## 2021-12-05 LAB — CBC WITH DIFFERENTIAL/PLATELET
Abs Immature Granulocytes: 0.01 10*3/uL (ref 0.00–0.07)
Basophils Absolute: 0.1 10*3/uL (ref 0.0–0.1)
Basophils Relative: 1 %
Eosinophils Absolute: 0.1 10*3/uL (ref 0.0–0.5)
Eosinophils Relative: 2 %
HCT: 38 % (ref 36.0–46.0)
Hemoglobin: 12.2 g/dL (ref 12.0–15.0)
Immature Granulocytes: 0 %
Lymphocytes Relative: 9 %
Lymphs Abs: 0.5 10*3/uL — ABNORMAL LOW (ref 0.7–4.0)
MCH: 32 pg (ref 26.0–34.0)
MCHC: 32.1 g/dL (ref 30.0–36.0)
MCV: 99.7 fL (ref 80.0–100.0)
Monocytes Absolute: 0.3 10*3/uL (ref 0.1–1.0)
Monocytes Relative: 6 %
Neutro Abs: 4.1 10*3/uL (ref 1.7–7.7)
Neutrophils Relative %: 82 %
Platelets: 252 10*3/uL (ref 150–400)
RBC: 3.81 MIL/uL — ABNORMAL LOW (ref 3.87–5.11)
RDW: 18.3 % — ABNORMAL HIGH (ref 11.5–15.5)
WBC: 5.1 10*3/uL (ref 4.0–10.5)
nRBC: 0 % (ref 0.0–0.2)

## 2021-12-05 LAB — RESP PANEL BY RT-PCR (FLU A&B, COVID) ARPGX2
Influenza A by PCR: NEGATIVE
Influenza B by PCR: NEGATIVE
SARS Coronavirus 2 by RT PCR: NEGATIVE

## 2021-12-05 MED ORDER — ONDANSETRON HCL 4 MG/2ML IJ SOLN
4.0000 mg | Freq: Once | INTRAMUSCULAR | Status: AC
  Administered 2021-12-05: 4 mg via INTRAVENOUS
  Filled 2021-12-05: qty 2

## 2021-12-05 MED ORDER — HYDROCODONE-ACETAMINOPHEN 5-325 MG PO TABS
1.0000 | ORAL_TABLET | ORAL | 0 refills | Status: DC | PRN
Start: 2021-12-05 — End: 2021-12-16
  Filled 2021-12-05: qty 10, 2d supply, fill #0

## 2021-12-05 MED ORDER — IOHEXOL 300 MG/ML  SOLN
100.0000 mL | Freq: Once | INTRAMUSCULAR | Status: AC | PRN
  Administered 2021-12-05: 80 mL via INTRAVENOUS

## 2021-12-05 MED ORDER — MORPHINE SULFATE (PF) 4 MG/ML IV SOLN
4.0000 mg | Freq: Once | INTRAVENOUS | Status: AC
  Administered 2021-12-05: 4 mg via INTRAVENOUS
  Filled 2021-12-05: qty 1

## 2021-12-05 NOTE — ED Provider Notes (Signed)
?[facility] ?Provider Note ? ? ?CSN: [id number] ?Arrival date & time: 12/05/21  0709 ? ?  ? ?History ? ?Chief Complaint  ?Patient presents with  ? Fall  ? Cough  ? ? ?[name] is a 88 y.o. female. ? ?Pt is an 88 yo female who has a pmhx significant for CAD, HTN, hyperlipidemia, COPD, polycythemia vera on hydroxyurea and pulmonary arterial hypertension.  She is on chronic 5L oxygen at home.  She fell about 3 weeks ago and hurt her right side.  She said it's been very painful, but she has not had it checked out because her husband also fell and developed a large SDH.  He just passed away a few days ago.  She has now developed a cough and her son has finally convinced her to get checked out.  Pt said the pain is severe when she tries to cough.  No fevers.  She admits to not taking her meds and inhalers as she is supposed to for the last few days due to her husband dying. ? ? ?  ? ?Home Medications ?Prior to Admission medications   ?Medication Sig Start Date End Date Taking? Authorizing Provider  ?HYDROcodone-acetaminophen (NORCO/VICODIN) 5-325 MG tablet Take 1 tablet by mouth every 4 (four) hours as needed. 12/05/21  Yes Isla Pence, MD  ?amLODipine (NORVASC) 10 MG tablet Take 1 tablet (10 mg total) by mouth daily. 03/12/21 03/12/22  Patwardhan, Reynold Bowen, MD  ?aspirin EC 81 MG tablet Take 1 tablet (81 mg total) by mouth daily. Swallow whole. 06/18/20   Patwardhan, Reynold Bowen, MD  ?atorvastatin (LIPITOR) 40 MG tablet Take 1 tablet (40 mg total) by mouth daily. 10/17/21   Patwardhan, Reynold Bowen, MD  ?bisoprolol (ZEBETA) 5 MG tablet Take 1 tablet (5 mg total) by mouth daily. 03/12/21   Patwardhan, Reynold Bowen, MD  ?Cholecalciferol (VITAMIN D3) 250 MCG (10000 UT) capsule Take 1 capsule (10,000 Units total) by mouth daily. 07/06/20   Nicholas Lose, MD  ?denosumab (PROLIA) 60 MG/ML SOSY injection Inject 60 mg into the skin every 6 (six) months. ?Patient not taking: Reported on 10/15/2021    [provider]  ?Ferrous Sulfate (IRON) 325 (65 Fe) MG TABS     [provider]  ?furosemide (LASIX) 40 MG tablet Take 0.5 tablets (20 mg total) by mouth 2 (two) times daily. 07/18/21   Patwardhan, Reynold Bowen, MD  ?hydroxyurea (HYDREA) 500 MG capsule Take 1 capsule (500 mg total) by mouth daily. 07/24/21   Nicholas Lose, MD  ?irbesartan (AVAPRO) 300 MG tablet Take 1 tablet (300 mg total) by mouth at bedtime. 06/18/20   Patwardhan, Reynold Bowen, MD  ?isosorbide mononitrate (IMDUR) 30 MG 24 hr tablet TAKE 1 TABLET (30 MG TOTAL) BY MOUTH DAILY. 05/28/21   Patwardhan, Reynold Bowen, MD  ?KLOR-CON M20 20 MEQ tablet TAKE 1 TABLET BY MOUTH EVERY DAY 05/16/21   Patwardhan, Reynold Bowen, MD  ?macitentan (OPSUMIT) 10 MG tablet Take 1 tablet (10 mg total) by mouth daily. 06/20/21   Patwardhan, Reynold Bowen, MD  ?nitroGLYCERIN (NITROSTAT) 0.4 MG SL tablet Place 1 tablet (0.4 mg total) under the tongue every 5 (five) minutes x 3 doses as needed for chest pain. 12/29/20   Adrian Prows, MD  ?nystatin ointment (MYCOSTATIN) Apply 1 application topically 2 (two) times daily as needed (rash). ?Patient not taking: Reported on 10/15/2021 04/29/21   [provider]  ?pantoprazole (PROTONIX) 40 MG tablet TAKE 1 TABLET (40 MG TOTAL) BY MOUTH DAILY.  07/02/21   Patwardhan, Reynold Bowen, MD  ?sertraline (ZOLOFT) 100 MG tablet Take 100 mg by mouth daily. 04/24/20   [provider]  ?Tiotropium Bromide Monohydrate (SPIRIVA RESPIMAT) 2.5 MCG/ACT AERS     [provider]  ?Tiotropium Bromide-Olodaterol (STIOLTO RESPIMAT) 2.5-2.5 MCG/ACT AERS INHALE 2 PUFFS INTO THE LUNGS DAILY. 10/02/21   Hunsucker, Bonna Gains, MD  ?Treprostinil (TYVASO REFILL) 0.6 MG/ML SOLN Inhale 18 mcg into the lungs 4 (four) times daily. Titrate up by 3 inhalations every 1-2 weeks as tolerated to goal inhalation of 12 per dose. 06/20/21   Patwardhan, Reynold Bowen, MD  ?   ? ?Allergies    ?Latex   ? ?Review of Systems   ?Review of Systems  ?Gastrointestinal:  Positive for  abdominal pain.  ?Musculoskeletal:  Positive for back pain.  ?     Right sided chest pain  ?All other systems reviewed and are negative. ? ?Physical Exam ?Updated Vital Signs ?BP 123/78 (BP Location: Right Arm)   Pulse 65   Temp 98.2 ?F (36.8 ?C) (Oral)   Resp 18   Ht 5' (1.524 m)   Wt 47.6 kg   SpO2 96%   BMI 20.51 kg/m?  ?Physical Exam ?Vitals and nursing note reviewed.  ?Constitutional:   ?   Appearance: Normal appearance.  ?HENT:  ?   Head: Normocephalic and atraumatic.  ?   Right Ear: External ear normal.  ?   Left Ear: External ear normal.  ?   Nose: Nose normal.  ?   Mouth/Throat:  ?   Mouth: Mucous membranes are moist.  ?   Pharynx: Oropharynx is clear.  ?Eyes:  ?   Extraocular Movements: Extraocular movements intact.  ?   Conjunctiva/sclera: Conjunctivae normal.  ?   Pupils: Pupils are equal, round, and reactive to light.  ?Cardiovascular:  ?   Rate and Rhythm: Normal rate and regular rhythm.  ?   Pulses: Normal pulses.  ?   Heart sounds: Normal heart sounds.  ?Pulmonary:  ?   Effort: Pulmonary effort is normal.  ?   Breath sounds: Normal breath sounds.  ?Abdominal:  ?   General: Abdomen is flat. Bowel sounds are normal.  ?   Palpations: Abdomen is soft.  ? ? ?Musculoskeletal:     ?   General: Normal range of motion.  ?     Arms: ? ?   Cervical back: Normal range of motion and neck supple.  ?Skin: ?   General: Skin is warm.  ?   Capillary Refill: Capillary refill takes less than 2 seconds.  ?Neurological:  ?   General: No focal deficit present.  ?   Mental Status: She is alert and oriented to person, place, and time.  ?Psychiatric:     ?   Mood and Affect: Mood normal.     ?   Behavior: Behavior normal.  ? ? ?ED Results / Procedures / Treatments   ?Labs ?(all labs ordered are listed, but only abnormal results are displayed) ?Labs Reviewed  ?COMPREHENSIVE METABOLIC PANEL - Abnormal; Notable for the following components:  ?    Result Value  ? Glucose, Bld 129 (*)   ? BUN 26 (*)   ? Calcium 8.5 (*)   ?  Total Protein 6.0 (*)   ? AST 13 (*)   ? All other components within normal limits  ?CBC WITH DIFFERENTIAL/PLATELET - Abnormal; Notable for the following components:  ? RBC 3.81 (*)   ? RDW 18.3 (*)   ?  Lymphs Abs 0.5 (*)   ? All other components within normal limits  ?RESP PANEL BY RT-PCR (FLU A&B, COVID) ARPGX2  ?URINALYSIS, ROUTINE W REFLEX MICROSCOPIC  ? ? ?EKG ?None ? ?Radiology ?CT CHEST ABDOMEN PELVIS W CONTRAST ? ?Result Date: 12/05/2021 ?CLINICAL DATA:  88 year old female with history of fall 3 weeks ago now with low back pain and cough. EXAM: CT CHEST, ABDOMEN, AND PELVIS WITH CONTRAST TECHNIQUE: Multidetector CT imaging of the chest, abdomen and pelvis was performed following the standard protocol during bolus administration of intravenous contrast. RADIATION DOSE REDUCTION: This exam was performed according to the departmental dose-optimization program which includes automated exposure control, adjustment of the mA and/or kV according to patient size and/or use of iterative reconstruction technique. CONTRAST:  75m OMNIPAQUE IOHEXOL 300 MG/ML  SOLN COMPARISON:  Chest CT from 06/18/2021 FINDINGS: CT CHEST FINDINGS Cardiovascular: Normal caliber and patent appearing thoracic aorta. Scattered atherosclerotic calcifications. Mitral annular and aortic valvular calcifications. Severe coronary atherosclerotic calcifications. Mediastinum/Nodes: No enlarged mediastinal, hilar, or axillary lymph nodes. Thyroid gland, trachea, and esophagus demonstrate no significant findings. Lungs/Pleura: Moderate upper lobe predominant centrilobular and paraseptal emphysema. Mild scattered subpleural reticular opacities. No suspicious pulmonary nodules. No pleural effusion or pneumothorax. Musculoskeletal: Subacute to chronic appearing left lateral, nondisplaced eighth rib fracture. Nondisplaced, subacute appearing fractures of the right sixth and seventh lateral ribs. Multilevel degenerative changes of the thoracic spine. CT  ABDOMEN PELVIS FINDINGS Hepatobiliary: The liver is normal in size, contour, and attenuation. Similar appearing well-defined hypoattenuating lobular structure in the left lobe of the liver measuring up to 2.2 cm, compatibl

## 2021-12-05 NOTE — ED Triage Notes (Signed)
Pt arrives to ED with c/o fall and cough. Pt reports she fell around x3 weeks ago injuring her back. Pt reports she developed a cough x3 days ago. She reports she is having pain in her side that radiates across her stomach when she coughs.  ?

## 2021-12-05 NOTE — ED Notes (Signed)
Patient educated on the use and need of IS. Patient demonstrated good effort and understanding. Patient additionally instructed on splinting to reduce pain with coughing. Patient appreciative of education.  ?

## 2021-12-05 NOTE — ED Notes (Signed)
Patient asked to provide urine specimen. Patients son states patient is able to ambulate without her oxygen for a few minutes. EMT requested to place patient on bedpan. Patient insisted on ambulating without oxygen. Patient ambulated with steady gait.  ?

## 2021-12-11 ENCOUNTER — Other Ambulatory Visit: Payer: Self-pay

## 2021-12-11 ENCOUNTER — Emergency Department (HOSPITAL_BASED_OUTPATIENT_CLINIC_OR_DEPARTMENT_OTHER)
Admission: EM | Admit: 2021-12-11 | Discharge: 2021-12-11 | Disposition: A | Discharge status: Home / Self Care | Payer: Medicare HMO | Attending: Emergency Medicine | Admitting: Emergency Medicine

## 2021-12-11 ENCOUNTER — Encounter (HOSPITAL_BASED_OUTPATIENT_CLINIC_OR_DEPARTMENT_OTHER): Payer: Self-pay

## 2021-12-11 ENCOUNTER — Emergency Department (HOSPITAL_BASED_OUTPATIENT_CLINIC_OR_DEPARTMENT_OTHER): Payer: Medicare HMO

## 2021-12-11 DIAGNOSIS — J8410 Pulmonary fibrosis, unspecified: Secondary | ICD-10-CM | POA: Insufficient documentation

## 2021-12-11 DIAGNOSIS — I10 Essential (primary) hypertension: Secondary | ICD-10-CM | POA: Insufficient documentation

## 2021-12-11 DIAGNOSIS — Z79899 Other long term (current) drug therapy: Secondary | ICD-10-CM | POA: Insufficient documentation

## 2021-12-11 DIAGNOSIS — J439 Emphysema, unspecified: Secondary | ICD-10-CM | POA: Diagnosis not present

## 2021-12-11 DIAGNOSIS — R0602 Shortness of breath: Secondary | ICD-10-CM | POA: Diagnosis not present

## 2021-12-11 DIAGNOSIS — J449 Chronic obstructive pulmonary disease, unspecified: Secondary | ICD-10-CM | POA: Diagnosis not present

## 2021-12-11 DIAGNOSIS — Z7951 Long term (current) use of inhaled steroids: Secondary | ICD-10-CM | POA: Insufficient documentation

## 2021-12-11 DIAGNOSIS — Z91040 Latex allergy status: Secondary | ICD-10-CM | POA: Diagnosis not present

## 2021-12-11 DIAGNOSIS — Z7982 Long term (current) use of aspirin: Secondary | ICD-10-CM | POA: Diagnosis not present

## 2021-12-11 DIAGNOSIS — E876 Hypokalemia: Secondary | ICD-10-CM | POA: Diagnosis not present

## 2021-12-11 LAB — CBC WITH DIFFERENTIAL/PLATELET
Abs Immature Granulocytes: 0.04 10*3/uL (ref 0.00–0.07)
Basophils Absolute: 0.1 10*3/uL (ref 0.0–0.1)
Basophils Relative: 1 %
Eosinophils Absolute: 0.3 10*3/uL (ref 0.0–0.5)
Eosinophils Relative: 3 %
HCT: 40.9 % (ref 36.0–46.0)
Hemoglobin: 13.3 g/dL (ref 12.0–15.0)
Immature Granulocytes: 0 %
Lymphocytes Relative: 9 %
Lymphs Abs: 0.8 10*3/uL (ref 0.7–4.0)
MCH: 32.3 pg (ref 26.0–34.0)
MCHC: 32.5 g/dL (ref 30.0–36.0)
MCV: 99.3 fL (ref 80.0–100.0)
Monocytes Absolute: 0.7 10*3/uL (ref 0.1–1.0)
Monocytes Relative: 7 %
Neutro Abs: 7.9 10*3/uL — ABNORMAL HIGH (ref 1.7–7.7)
Neutrophils Relative %: 80 %
Platelets: 334 10*3/uL (ref 150–400)
RBC: 4.12 MIL/uL (ref 3.87–5.11)
RDW: 18 % — ABNORMAL HIGH (ref 11.5–15.5)
WBC: 9.8 10*3/uL (ref 4.0–10.5)
nRBC: 0 % (ref 0.0–0.2)

## 2021-12-11 LAB — COMPREHENSIVE METABOLIC PANEL WITH GFR
ALT: 12 U/L (ref 0–44)
AST: 15 U/L (ref 15–41)
Albumin: 3.7 g/dL (ref 3.5–5.0)
Alkaline Phosphatase: 51 U/L (ref 38–126)
Anion gap: 9 (ref 5–15)
BUN: 20 mg/dL (ref 8–23)
CO2: 28 mmol/L (ref 22–32)
Calcium: 9 mg/dL (ref 8.9–10.3)
Chloride: 106 mmol/L (ref 98–111)
Creatinine, Ser: 0.8 mg/dL (ref 0.44–1.00)
GFR, Estimated: >60 mL/min
Glucose, Bld: 82 mg/dL (ref 70–99)
Potassium: 3.4 mmol/L — ABNORMAL LOW (ref 3.5–5.1)
Sodium: 143 mmol/L (ref 135–145)
Total Bilirubin: 1.1 mg/dL (ref 0.3–1.2)
Total Protein: 6.6 g/dL (ref 6.5–8.1)

## 2021-12-11 LAB — TROPONIN I (HIGH SENSITIVITY)
Troponin I (High Sensitivity): 45 ng/L — ABNORMAL HIGH (ref <18)
Troponin I (High Sensitivity): 36 ng/L — ABNORMAL HIGH (ref <18)

## 2021-12-11 LAB — BRAIN NATRIURETIC PEPTIDE: B Natriuretic Peptide: 496.4 pg/mL — ABNORMAL HIGH (ref 0.0–100.0)

## 2021-12-11 NOTE — ED Triage Notes (Signed)
Patient c/o increased SOB since leaving this facility the other day dx with broken ribs. Per son, patient was instructed by PCP to return to ED if O2 read below 90%. Patient reports home O2 was 87% on her normal 5L [place] ?

## 2021-12-11 NOTE — Discharge Instructions (Signed)
Would recommend shortening her oxygen tubing length at home and would recommend making arrangements for follow-up with pulmonary medicine again that has not seen her for about a year.  Today's work-up without any significant cardiac event.  Chest x-ray is clear no evidence of pneumonia.  No wheezing.  Does have a fairly wet cough which could go along with some bronchitis.  Chest x-ray showing fairly significant pulmonary fibrosis which appears to be getting worse. ? ?Turn for any new or worse symptoms. ?

## 2021-12-11 NOTE — ED Provider Notes (Signed)
?[facility] DEPT ?Provider Note ? ? ?CSN: [id number] ?Arrival date & time: 12/11/21  4270 ? ?  ? ?History ? ?Chief Complaint  ?Patient presents with  ? Shortness of Breath  ? ? ?Melissa Barron is a 88 y.o. female. ? ?Patient followed by [facility] cardiovascular.  Patient has a history significant for hypertension hyperlipidemia coronary artery disease moderate atrial stenosis COPD depression GERD and now with severe right artery hypertension diagnosed in October 22.  Patient is on 5 L of oxygen on a regular basis.  Patient been seen by pulmonary medicine in the past.  Last time they saw her was about a year ago.  Patient seen here April 6 following a series of falls that occurred a few weeks before with some evidence of subacute rib fractures.  Patient was cleared for discharge at that time.  Patient last seen by cardiology January 25. ? ?Patient brought in by her son for increasing shortness of breath.  Said oxygen levels below at home.  Upon arrival here she levels have been 93% or better on her normal 5 L.  Patient not tachypneic.  But patient does have a wet cough.  Patient clinical concerns for possible pneumonia. ? ? ?  ? ?Home Medications ?Prior to Admission medications   ?Medication Sig Start Date End Date Taking? Authorizing Provider  ?amLODipine (NORVASC) 10 MG tablet Take 1 tablet (10 mg total) by mouth daily. 03/12/21 03/12/22  Patwardhan, Reynold Bowen, MD  ?aspirin EC 81 MG tablet Take 1 tablet (81 mg total) by mouth daily. Swallow whole. 06/18/20   Patwardhan, Reynold Bowen, MD  ?atorvastatin (LIPITOR) 40 MG tablet Take 1 tablet (40 mg total) by mouth daily. 10/17/21   Patwardhan, Reynold Bowen, MD  ?bisoprolol (ZEBETA) 5 MG tablet Take 1 tablet (5 mg total) by mouth daily. 03/12/21   Patwardhan, Reynold Bowen, MD  ?Cholecalciferol (VITAMIN D3) 250 MCG (10000 UT) capsule Take 1 capsule (10,000 Units total) by mouth daily. 07/06/20   Nicholas Lose, MD  ?denosumab (PROLIA) 60 MG/ML SOSY injection Inject 60 mg  into the skin every 6 (six) months. ?Patient not taking: Reported on 10/15/2021    [provider]  ?Ferrous Sulfate (IRON) 325 (65 Fe) MG TABS     [provider]  ?furosemide (LASIX) 40 MG tablet Take 0.5 tablets (20 mg total) by mouth 2 (two) times daily. 07/18/21   Patwardhan, Reynold Bowen, MD  ?HYDROcodone-acetaminophen (NORCO/VICODIN) 5-325 MG tablet Take 1 tablet by mouth every 4 (four) hours as needed. 12/05/21   Isla Pence, MD  ?hydroxyurea (HYDREA) 500 MG capsule Take 1 capsule (500 mg total) by mouth daily. 07/24/21   Nicholas Lose, MD  ?irbesartan (AVAPRO) 300 MG tablet Take 1 tablet (300 mg total) by mouth at bedtime. 06/18/20   Patwardhan, Reynold Bowen, MD  ?isosorbide mononitrate (IMDUR) 30 MG 24 hr tablet TAKE 1 TABLET (30 MG TOTAL) BY MOUTH DAILY. 05/28/21   Patwardhan, Reynold Bowen, MD  ?KLOR-CON M20 20 MEQ tablet TAKE 1 TABLET BY MOUTH EVERY DAY 05/16/21   Patwardhan, Reynold Bowen, MD  ?macitentan (OPSUMIT) 10 MG tablet Take 1 tablet (10 mg total) by mouth daily. 06/20/21   Patwardhan, Reynold Bowen, MD  ?nitroGLYCERIN (NITROSTAT) 0.4 MG SL tablet Place 1 tablet (0.4 mg total) under the tongue every 5 (five) minutes x 3 doses as needed for chest pain. 12/29/20   Adrian Prows, MD  ?nystatin ointment (MYCOSTATIN) Apply 1 application topically 2 (two) times daily as needed (rash). ?Patient not taking:  Reported on 10/15/2021 04/29/21   [provider]  ?pantoprazole (PROTONIX) 40 MG tablet TAKE 1 TABLET (40 MG TOTAL) BY MOUTH DAILY. 07/02/21   Patwardhan, Reynold Bowen, MD  ?sertraline (ZOLOFT) 100 MG tablet Take 100 mg by mouth daily. 04/24/20   [provider]  ?Tiotropium Bromide Monohydrate (SPIRIVA RESPIMAT) 2.5 MCG/ACT AERS     [provider]  ?Tiotropium Bromide-Olodaterol (STIOLTO RESPIMAT) 2.5-2.5 MCG/ACT AERS INHALE 2 PUFFS INTO THE LUNGS DAILY. 10/02/21   Hunsucker, Bonna Gains, MD  ?Treprostinil (TYVASO REFILL) 0.6 MG/ML SOLN Inhale 18 mcg into the lungs 4 (four) times daily.  Titrate up by 3 inhalations every 1-2 weeks as tolerated to goal inhalation of 12 per dose. 06/20/21   Patwardhan, Reynold Bowen, MD  ?   ? ?Allergies    ?Latex   ? ?Review of Systems   ?Review of Systems  ?Constitutional:  Negative for chills and fever.  ?HENT:  Negative for ear pain and sore throat.   ?Eyes:  Negative for pain and visual disturbance.  ?Respiratory:  Positive for cough and shortness of breath.   ?Cardiovascular:  Negative for chest pain and palpitations.  ?Gastrointestinal:  Negative for abdominal pain and vomiting.  ?Genitourinary:  Negative for dysuria and hematuria.  ?Musculoskeletal:  Negative for arthralgias and back pain.  ?Skin:  Negative for color change and rash.  ?Neurological:  Negative for seizures and syncope.  ?All other systems reviewed and are negative. ? ?Physical Exam ?Updated Vital Signs ?BP (!) 145/69   Pulse 65   Temp 98.8 ?F (37.1 ?C) (Oral)   Resp (!) 26   Ht 1.524 m (5')   Wt 47.6 kg   SpO2 92%   BMI 20.51 kg/m?  ?Physical Exam ?Vitals and nursing note reviewed.  ?Constitutional:   ?   General: She is not in acute distress. ?   Appearance: Normal appearance. She is well-developed.  ?HENT:  ?   Head: Normocephalic and atraumatic.  ?Eyes:  ?   Extraocular Movements: Extraocular movements intact.  ?   Conjunctiva/sclera: Conjunctivae normal.  ?   Pupils: Pupils are equal, round, and reactive to light.  ?Cardiovascular:  ?   Rate and Rhythm: Normal rate and regular rhythm.  ?   Heart sounds: No murmur heard. ?Pulmonary:  ?   Effort: Pulmonary effort is normal. No respiratory distress.  ?   Breath sounds: Rhonchi present. No wheezing or rales.  ?Abdominal:  ?   Palpations: Abdomen is soft.  ?   Tenderness: There is no abdominal tenderness.  ?Musculoskeletal:     ?   General: No swelling.  ?   Cervical back: Normal range of motion and neck supple.  ?Skin: ?   General: Skin is warm and dry.  ?   Capillary Refill: Capillary refill takes less than 2 seconds.  ?Neurological:  ?    General: No focal deficit present.  ?   Mental Status: She is alert and oriented to person, place, and time.  ?   Cranial Nerves: No cranial nerve deficit.  ?   Sensory: No sensory deficit.  ?   Motor: No weakness.  ?Psychiatric:     ?   Mood and Affect: Mood normal.  ? ? ?ED Results / Procedures / Treatments   ?Labs ?(all labs ordered are listed, but only abnormal results are displayed) ?Labs Reviewed  ?BRAIN NATRIURETIC PEPTIDE - Abnormal; Notable for the following components:  ?    Result Value  ? B Natriuretic Peptide 496.4 (*)   ?  All other components within normal limits  ?COMPREHENSIVE METABOLIC PANEL - Abnormal; Notable for the following components:  ? Potassium 3.4 (*)   ? All other components within normal limits  ?CBC WITH DIFFERENTIAL/PLATELET - Abnormal; Notable for the following components:  ? RDW 18.0 (*)   ? Neutro Abs 7.9 (*)   ? All other components within normal limits  ?TROPONIN I (HIGH SENSITIVITY) - Abnormal; Notable for the following components:  ? Troponin I (High Sensitivity) 45 (*)   ? All other components within normal limits  ?TROPONIN I (HIGH SENSITIVITY) - Abnormal; Notable for the following components:  ? Troponin I (High Sensitivity) 36 (*)   ? All other components within normal limits  ? ? ?EKG ?EKG Interpretation ? ?Date/Time:  Wednesday December 11 2021 09:23:50 EDT ?Ventricular Rate:  67 ?PR Interval:  231 ?QRS Duration: 99 ?QT Interval:  413 ?QTC Calculation: 436 ?R Axis:   54 ?Text Interpretation: Sinus rhythm Prolonged PR interval Probable left ventricular hypertrophy Anterior Q waves, possibly due to LVH No significant change since last tracing Confirmed by Fredia Sorrow [phone]) on 12/11/2021 9:51:11 AM ? ?Radiology ?DG Chest Port 1 View ? ?Result Date: 12/11/2021 ?CLINICAL DATA:  Shortness of breath EXAM: PORTABLE CHEST 1 VIEW COMPARISON:  09/10/2021 FINDINGS: Emphysema with chronic interstitial changes. No new consolidation or edema. No pleural effusion or pneumothorax. Stable  cardiomediastinal contours. IMPRESSION: No acute process in the chest. Emphysema with chronic interstitial changes. Electronically Signed   By: Macy Mis M.D.   On: 12/11/2021 10:03   ? ?Procedures ?Procedu

## 2021-12-12 ENCOUNTER — Encounter: Payer: Self-pay | Admitting: Student

## 2021-12-12 ENCOUNTER — Telehealth: Payer: Self-pay | Admitting: Pulmonary Disease

## 2021-12-12 ENCOUNTER — Ambulatory Visit: Payer: Medicare HMO | Admitting: Student

## 2021-12-12 VITALS — BP 120/44 | HR 88 | Temp 98.0°F | Resp 17 | Ht 60.0 in | Wt 103.0 lb

## 2021-12-12 DIAGNOSIS — I350 Nonrheumatic aortic (valve) stenosis: Secondary | ICD-10-CM | POA: Diagnosis not present

## 2021-12-12 DIAGNOSIS — J9611 Chronic respiratory failure with hypoxia: Secondary | ICD-10-CM | POA: Diagnosis not present

## 2021-12-12 DIAGNOSIS — I2721 Secondary pulmonary arterial hypertension: Secondary | ICD-10-CM

## 2021-12-12 NOTE — Telephone Encounter (Signed)
Called and informed son of recommendations from Dr Melvyn Novas. I do have patient scheduled to see Dr Erin Fulling Monday at 10:30am to get her checked for an emergency inhaler, oxygen and her panic attacks.  ? ?Advised son that if she cant stay in the 90s at rest on her oxygen she needs to go to ER and inform us that she is going to the ED. Son voiced understanding. Nothing further needed  ?

## 2021-12-12 NOTE — Progress Notes (Signed)
? ? ?Patient referred by [name], MD for coronary artery disease ? ?Subjective:  ? ?Melissa Barron, female    DOB: [birth year], 88 y.o.   MRN: [id number] ? ? ?Chief Complaint  ?Patient presents with  ? Hospitalization Follow-up  ? Hypertension  ? ? ?88 y.o. Caucasian female with hypertension, hyperlipidemia, CAD, mod AS, COPD, depression, GERD, now with severe PAH (diagnosed 06/2021) ? ?Patient is currently on Opsumit (Macitentan) 10 mg daily and Tyvaso (Treprostinil) 5 breaths 4 times daily.  ? ?Patient presents for urgent visit with worsening dyspnea on exertion and hypoxia.  Patient's son is present at bedside.  She was evaluated in the emergency department yesterday at which time chest x-ray showed stable chronic changes, no evidence of pulmonary edema.  Over the last several weeks patient has noticed dyspnea and hypoxia with exertion despite 5 L/min supplemental oxygen via nasal cannula. ? ?Notably since initiation of pulmonary hypertension therapy pulmonary hypertension has improved remarkably on repeat echocardiogram in 10/2021 showing near resolution of pulmonary hypertension and RV dilation.  Patient does have moderate aortic stenosis, however dyspnea and hypoxia are out of proportion to this. ? ?In the office today at rest patient's oxygen saturation 94%, following 2 laps in the hallway oxygen saturation dropped to 76%. ? ?Current Outpatient Medications on File Prior to Visit  ?Medication Sig Dispense Refill  ? amLODipine (NORVASC) 10 MG tablet Take 1 tablet (10 mg total) by mouth daily. 90 tablet 3  ? aspirin EC 81 MG tablet Take 1 tablet (81 mg total) by mouth daily. Swallow whole. 90 tablet 3  ? atorvastatin (LIPITOR) 40 MG tablet Take 1 tablet (40 mg total) by mouth daily. 90 tablet 3  ? bisoprolol (ZEBETA) 5 MG tablet Take 1 tablet (5 mg total) by mouth daily. 90 tablet 3  ? Cholecalciferol (VITAMIN D3) 250 MCG (10000 UT) capsule Take 1 capsule (10,000 Units total) by mouth daily.    ? Ferrous Sulfate  (IRON) 325 (65 Fe) MG TABS     ? HYDROcodone-acetaminophen (NORCO/VICODIN) 5-325 MG tablet Take 1 tablet by mouth every 4 (four) hours as needed. 10 tablet 0  ? hydroxyurea (HYDREA) 500 MG capsule Take 1 capsule (500 mg total) by mouth daily. 90 capsule 3  ? irbesartan (AVAPRO) 300 MG tablet Take 1 tablet (300 mg total) by mouth at bedtime. 90 tablet 3  ? isosorbide mononitrate (IMDUR) 30 MG 24 hr tablet TAKE 1 TABLET (30 MG TOTAL) BY MOUTH DAILY. 90 tablet 1  ? KLOR-CON M20 20 MEQ tablet TAKE 1 TABLET BY MOUTH EVERY DAY 90 tablet 1  ? macitentan (OPSUMIT) 10 MG tablet Take 1 tablet (10 mg total) by mouth daily. 90 tablet 2  ? nitroGLYCERIN (NITROSTAT) 0.4 MG SL tablet Place 1 tablet (0.4 mg total) under the tongue every 5 (five) minutes x 3 doses as needed for chest pain. 25 tablet 3  ? nystatin ointment (MYCOSTATIN) Apply 1 application. topically 2 (two) times daily as needed (rash).    ? pantoprazole (PROTONIX) 40 MG tablet TAKE 1 TABLET (40 MG TOTAL) BY MOUTH DAILY. 90 tablet 3  ? sertraline (ZOLOFT) 100 MG tablet Take 100 mg by mouth daily.    ? Tiotropium Bromide-Olodaterol (STIOLTO RESPIMAT) 2.5-2.5 MCG/ACT AERS INHALE 2 PUFFS INTO THE LUNGS DAILY. 12 g 3  ? Treprostinil (TYVASO REFILL) 0.6 MG/ML SOLN Inhale 18 mcg into the lungs 4 (four) times daily. Titrate up by 3 inhalations every 1-2 weeks as tolerated to goal inhalation of 12 per  dose. 2.9 mL 11  ? furosemide (LASIX) 40 MG tablet Take 0.5 tablets (20 mg total) by mouth 2 (two) times daily. 120 tablet 3  ? ?No current facility-administered medications on file prior to visit.  ? ? ?Cardiovascular and other pertinent studies: ?Echocardiogram 10/23/2021:  ?Left ventricle cavity is normal in size. Moderate concentric hypertrophy  ?of the left ventricle. Normal global wall motion. Normal LV systolic  ?function with EF 59%. Doppler evidence of grade I (impaired) diastolic  ?dysfunction, normal LAP.  ?Right ventricle cavity is normal in size. Normal right  ventricular  ?function.  ?Left atrial cavity is mildly dilated.  ?Structurally normal trileaflet aortic valve with moderate calcification.  ?Moderate aortic stenosis. Vmax 3.7 m/sec, mean PG 38 mmHg, AVA 1 cm? by  ?continuity equation. Dimensionless index 0.39.  ?Moderate (grade II) aortic regurgitation.  ?Mild tricuspid regurgitation. Estimated pulmonary artery systolic pressure  ?34 mmHg.  ?Compared to previous study on 06/19/2021, mod RA/RV dilatation resolved.  ?TR down from mod-severe to mild. Estimated PASP down from 57 to 34 mmHg.  ?Aortic mean PG increased from 27 to 38 mmHg.  ? ?VQ scan 09/10/2021: ?Very low probability for pulmonary embolism. ? ?[place] 06/18/2021: ?RA: 4 mmHg ?RV: 76/2 mmHg ?PA: 75/23 mmHg, mPAP 42 mmHg ?PCW: 6-7 mmHg ?(PCW was checked and confirmed in multiple locations-RU,RL,LU) ?  ?CO: 2.9 L/min ?CI: 2.0 L/min/m2 ?  ?PVR: 12 WU ?  ?Moderate pulmonary hypertension, WHO Grp I ?  ? ?Lexiscan (Walking with mod Bruce)Tetrofosmin Stress Test: 06/12/2021: ?1 Day Rest/Stress Protocol. ?Stress EKG is non-diagnostic, as this is pharmacological stress test using Lexiscan. ?Normal myocardial perfusion without convincing evidence of reversible myocardial ischemia or prior infarct. ?Left ventricular wall thickness preserved, no regional wall motion abnormalities, calculated LVEF 79%. ?Low risk study. ?No prior studies for comparison. ? ?EKG 05/24/2021: ?Sinus rhythm 56 bpm ?First degree A-V block  ?Anteroseptal infarct -age undetermined ?Nonspecific T wave inversion lead III ? ?Echocardiogram 01/01/2021: ?Left ventricle cavity is normal in size. Moderate concentric hypertrophy of the left ventricle. Normal global wall motion. Normal LV systolic function with EF 63%. Doppler evidence of grade I (impaired) diastolic dysfunction, normal LAP.  ?Left atrial cavity is moderately dilated. ?Trileaflet aortic valve with moderate calcification. Transvalvular mean PG 28 mmHg, max PG 44 mmHg, likely originating  predominantly from LVOT than true valvular stenosis.  ?Moderate (grade II) aortic regurgitation. ?Mild tricuspid regurgitation. Estimated pulmonary artery systolic pressure 37 mmHg.  ?No significant change compared to previous study on 06/26/2020.  ? ?Coronary intervention 12/28/2020: ?LM: Distal 20% stenosis ?LAD: Tortuous vessel ?       Prox-mid prior stent with focal 80% ISR ?       Attempted orbital atherectomy ?       Shockwave lithotripsy ?       PTCA and stent placement 3.5 X 12 mm Synergy drug-eluting stent ?       Intravascular ultrasound (IVUS) MSA 7.1 mm2 ?       80%-->0% residual stenosis ?       TIMI flow II-->III ?LCx: Tortuous vessel ?         Mid focal 40% stenosis ?RCA: Prox-mid prior stent ?        Mid 40% ISR ?        RV marginal branch with prox 90% diffuse disease ?  ?LV-Ao Peak-to-peak gradient 21 mmHg ?LV-Ao Mean PG 20 mmHg ?Moderate aortic stenosis ? ? ?Carotid artery duplex 06/26/2020:  ?Doppler velocity suggests stenosis in the right  internal carotid artery  ?(16-49%). Stenosis in the right external carotid artery (<50%).  ?Doppler velocity suggests stenosis in the left internal carotid artery  ?(50-69%). Stenosis in the left external carotid artery (<50%).  ?Antegrade right vertebral artery flow. Antegrade left vertebral artery  ?flow.  ?Follow up in six months is appropriate if clinically indicated. ? ?Recent labs: ?06/10/2021: ?Glucose 91, BUN/Cr 19/1.01. EGFR 54. Na/K 142/4.5.  ?BNP 849 ?H/H 16/49. MCV 88. Platelets 631 ? ?02/14/2021: ?BNP 790 high ? ?01/03/2021: ?H/H 13.7/42.4. MCV 103.9. Platelets 254 ? ?05/12/2020: ?Glucose 80, BUN/Cr 17/0.7. EGFR 79. Na/K 137/4.1. Rest of the CMP normal ?H/H 14/44. MCV 106. Platelets 280 ?HbA1C N/A ?Chol 124, TG 106, HDL 42, LDL 61 ?TSH N/A ? ? ? ?Review of Systems  ?Constitutional: Positive for malaise/fatigue.  ?Cardiovascular:  Positive for dyspnea on exertion (worsening). Negative for chest pain, leg swelling, palpitations and syncope.  ?Respiratory:   Positive for shortness of breath (woresning).   ?Neurological:  Positive for difficulty with concentration.  ? ?   ? ? ?Vitals:  ? 12/12/21 1415  ?BP: (!) 120/44  ?Pulse: 88  ?Resp: 17  ?Temp: 98 ?F (36.7 ?C)

## 2021-12-12 NOTE — Telephone Encounter (Signed)
Evelena Peat called me back at the office. Evelena Peat did notify me that patient has appointment with Junius Finner today at 2:15. ? ?Patient is having shortness of breath when she is around people and talking. She was noncompliant in the past with her medications due to her husband recently passing away. But patient has started her medication and inhalers again.  ? ?Patient was recommended to try to shorten her oxygen tubing at home when she is up walking around, she has the long green tubing at home. When she was at [facility] today for an appointment she was complaining of a severe headaches, coughing with no mucus production and SOB. Pt is currently on 5L and they were able to get her oxygen up to 92% from the low 80s.  ? ?Dr Melvyn Novas any advice since Dr Silas Flood is out of office.  ?

## 2021-12-12 NOTE — Telephone Encounter (Signed)
No additional recs, if can't get 02 above 90% at rest at highest setting then will need to go to ER  (unless of course NC[facility]/hospice pt)  ?

## 2021-12-12 NOTE — Telephone Encounter (Signed)
Called Jessie from [facility] and had to leave a voicemail for her to call me back at the office to get a more accurate update of this patient. ? ?Called son Wille Glaser and left a voicemail for him to call office back to get an update on his mother.  ? ?Waiting for call back from either one! ?

## 2021-12-16 ENCOUNTER — Encounter: Payer: Self-pay | Admitting: Pulmonary Disease

## 2021-12-16 ENCOUNTER — Ambulatory Visit: Payer: Medicare HMO | Admitting: Pulmonary Disease

## 2021-12-16 VITALS — BP 110/64 | HR 63 | Ht 60.0 in | Wt 101.2 lb

## 2021-12-16 DIAGNOSIS — I272 Pulmonary hypertension, unspecified: Secondary | ICD-10-CM

## 2021-12-16 DIAGNOSIS — J9611 Chronic respiratory failure with hypoxia: Secondary | ICD-10-CM

## 2021-12-16 DIAGNOSIS — J8410 Pulmonary fibrosis, unspecified: Secondary | ICD-10-CM | POA: Diagnosis not present

## 2021-12-16 DIAGNOSIS — J432 Centrilobular emphysema: Secondary | ICD-10-CM

## 2021-12-16 MED ORDER — HYDROCODONE BIT-HOMATROP MBR 5-1.5 MG/5ML PO SOLN: 5.0000 mL | Freq: Four times a day (QID) | ORAL | 0 refills | Status: AC | PRN

## 2021-12-16 NOTE — Patient Instructions (Addendum)
Use hycodan cough syrup at night for sleep and cough ? ?We will place referral to palliative care ? ?We will place an order for a motorized wheel chair ? ?Follow up as needed ? ?

## 2021-12-16 NOTE — Progress Notes (Signed)
? ?Synopsis: Referred in April 2023 for acute visit, followed by Dr. Silas Flood ? ?Subjective:  ? ?PATIENT ID: Melissa Barron GENDER: female DOB: 02-Mar-1931, MRN: [id number] ? ? ?HPI ? ?Chief Complaint  ?Patient presents with  ? Follow-up  ?  Per son, she was recently in the hospital for increased SOB. Uses 5L of O2. Increased cough for the past 2 weeks.   ? ?Melissa Barron is an 88 year old woman, former smoker with chronic respiratory failure due to emphysema, pulmonary vascular disease and chronic diastolic heart failure who returns to pulmonary clinic for acute visit.  ? ?She was recently evaluated in the ED on 12/11/21 for increasing dyspnea and oxygen requirements along with cough.  ? ?CT Chest 12/05/21 shows emphysematous changes with course subpleural reticular findings concerning for pulmonary fibrosis.  ? ?He son has accompanied her on today's visit. ? ?She was seen by her cardiology team on 12/12/21, note reviewed. She is on opsumit and tyvaso for pulmonary hypertension with improvement in her pressures. Given her increase in O2 needs despite maximal treatment for PAH, they recommended palliative care consult.  ? ?Past Medical History:  ?Diagnosis Date  ? Coronary artery disease   ? Emphysema, unspecified ([place])   ? Both  ? Heart attack ([place]) 2016  ? Hyperlipidemia   ? Hypertension   ?  ? ?Family History  ?Problem Relation Age of Onset  ? Heart attack Mother   ? Parkinson's disease Sister   ? Emphysema Sister   ? Colon cancer Brother   ?  ? ?Social History  ? ?Socioeconomic History  ? Marital status: Widowed  ?  Spouse name: Not on file  ? Number of children: 4  ? Years of education: 41  ? Highest education level: High school graduate  ?Occupational History  ? Occupation: Retired  ?Tobacco Use  ? Smoking status: Former  ?  Packs/day: 1.50  ?  Years: 30.00  ?  Pack years: 45.00  ?  Types: Cigarettes  ?  Quit date: 19  ?  Years since quitting: 43.3  ? Smokeless tobacco: Never  ?Vaping Use  ? Vaping Use: Never used   ?Substance and Sexual Activity  ? Alcohol use: Yes  ?  Alcohol/week: 1.0 standard drink  ?  Types: 1 Glasses of wine per week  ?  Comment: 7 days a week/1 a day  ? Drug use: Never  ? Sexual activity: Not on file  ?Other Topics Concern  ? Not on file  ?Social History Narrative  ? Not on file  ? ?Social Determinants of Health  ? ?Financial Resource Strain: Not on file  ?Food Insecurity: Not on file  ?Transportation Needs: Not on file  ?Physical Activity: Not on file  ?Stress: Not on file  ?Social Connections: Not on file  ?Intimate Partner Violence: Not on file  ?  ? ?Allergies  ?Allergen Reactions  ? Latex Itching and Rash  ?   ?  ?  ? ?Outpatient Medications Prior to Visit  ?Medication Sig Dispense Refill  ? ALPRAZolam (XANAX) 0.25 MG tablet Take 0.25 mg by mouth as needed.    ? amLODipine (NORVASC) 10 MG tablet Take 1 tablet (10 mg total) by mouth daily. 90 tablet 3  ? aspirin EC 81 MG tablet Take 1 tablet (81 mg total) by mouth daily. Swallow whole. 90 tablet 3  ? atorvastatin (LIPITOR) 40 MG tablet Take 1 tablet (40 mg total) by mouth daily. 90 tablet 3  ? bisoprolol (ZEBETA) 5 MG  tablet Take 1 tablet (5 mg total) by mouth daily. 90 tablet 3  ? Cholecalciferol (VITAMIN D3) 250 MCG (10000 UT) capsule Take 1 capsule (10,000 Units total) by mouth daily.    ? Ferrous Sulfate (IRON) 325 (65 Fe) MG TABS     ? furosemide (LASIX) 40 MG tablet Take 0.5 tablets (20 mg total) by mouth 2 (two) times daily. 120 tablet 3  ? hydroxyurea (HYDREA) 500 MG capsule Take 1 capsule (500 mg total) by mouth daily. 90 capsule 3  ? irbesartan (AVAPRO) 300 MG tablet Take 1 tablet (300 mg total) by mouth at bedtime. 90 tablet 3  ? isosorbide mononitrate (IMDUR) 30 MG 24 hr tablet TAKE 1 TABLET (30 MG TOTAL) BY MOUTH DAILY. 90 tablet 1  ? KLOR-CON M20 20 MEQ tablet TAKE 1 TABLET BY MOUTH EVERY DAY 90 tablet 1  ? macitentan (OPSUMIT) 10 MG tablet Take 1 tablet (10 mg total) by mouth daily. 90 tablet 2  ? nystatin ointment (MYCOSTATIN) Apply  1 application. topically 2 (two) times daily as needed (rash).    ? pantoprazole (PROTONIX) 40 MG tablet TAKE 1 TABLET (40 MG TOTAL) BY MOUTH DAILY. 90 tablet 3  ? sertraline (ZOLOFT) 100 MG tablet Take 100 mg by mouth daily.    ? Tiotropium Bromide-Olodaterol (STIOLTO RESPIMAT) 2.5-2.5 MCG/ACT AERS INHALE 2 PUFFS INTO THE LUNGS DAILY. 12 g 3  ? Treprostinil (TYVASO REFILL) 0.6 MG/ML SOLN Inhale 18 mcg into the lungs 4 (four) times daily. Titrate up by 3 inhalations every 1-2 weeks as tolerated to goal inhalation of 12 per dose. 2.9 mL 11  ? HYDROcodone-acetaminophen (NORCO/VICODIN) 5-325 MG tablet Take 1 tablet by mouth every 4 (four) hours as needed. 10 tablet 0  ? nitroGLYCERIN (NITROSTAT) 0.4 MG SL tablet Place 1 tablet (0.4 mg total) under the tongue every 5 (five) minutes x 3 doses as needed for chest pain. 25 tablet 3  ? ?No facility-administered medications prior to visit.  ? ?Review of Systems  ?Constitutional:  Positive for malaise/fatigue. Negative for chills, fever and weight loss.  ?HENT:  Negative for congestion, sinus pain and sore throat.   ?Eyes: Negative.   ?Respiratory:  Positive for shortness of breath. Negative for cough, hemoptysis, sputum production and wheezing.   ?Cardiovascular:  Negative for chest pain, palpitations, orthopnea, claudication and leg swelling.  ?Gastrointestinal:  Negative for abdominal pain, heartburn, nausea and vomiting.  ?Genitourinary: Negative.   ?Musculoskeletal:  Negative for joint pain and myalgias.  ?Skin:  Negative for rash.  ?Neurological:  Negative for weakness.  ?Endo/Heme/Allergies: Negative.   ?Psychiatric/Behavioral: Negative.    ? ?Objective:  ? ?Vitals:  ? 12/16/21 1036  ?BP: 110/64  ?Pulse: 63  ?SpO2: 92%  ?Weight: 101 lb 3.2 oz (45.9 kg)  ?Height: 5' (1.524 m)  ? ? ?Physical Exam ?Constitutional:   ?   General: She is not in acute distress. ?   Appearance: She is ill-appearing (chronically).  ?HENT:  ?   Head: Normocephalic and atraumatic.  ?Eyes:  ?    General: No scleral icterus. ?   Conjunctiva/sclera: Conjunctivae normal.  ?   Pupils: Pupils are equal, round, and reactive to light.  ?Cardiovascular:  ?   Rate and Rhythm: Normal rate and regular rhythm.  ?   Pulses: Normal pulses.  ?   Heart sounds: Murmur heard.  ?Pulmonary:  ?   Effort: Pulmonary effort is normal.  ?   Breath sounds: Rales present. No wheezing or rhonchi.  ?Abdominal:  ?  General: Bowel sounds are normal.  ?   Palpations: Abdomen is soft.  ?Musculoskeletal:  ?   Right lower leg: No edema.  ?   Left lower leg: No edema.  ?Lymphadenopathy:  ?   Cervical: No cervical adenopathy.  ?Skin: ?   General: Skin is warm and dry.  ?Neurological:  ?   General: No focal deficit present.  ?   Mental Status: She is alert.  ?Psychiatric:     ?   Mood and Affect: Mood normal.     ?   Behavior: Behavior normal.     ?   Thought Content: Thought content normal.     ?   Judgment: Judgment normal.  ? ?CBC ?   ?Component Value Date/Time  ? WBC 9.8 12/11/2021 0930  ? RBC 4.12 12/11/2021 0930  ? HGB 13.3 12/11/2021 0930  ? HGB 14.2 07/24/2021 1500  ? HGB 16.2 (H) 06/10/2021 1448  ? HCT 40.9 12/11/2021 0930  ? HCT 49.7 (H) 06/10/2021 1448  ? PLT 334 12/11/2021 0930  ? PLT 636 (H) 07/24/2021 1500  ? PLT 631 (H) 06/10/2021 1448  ? MCV 99.3 12/11/2021 0930  ? MCV 88 06/10/2021 1448  ? MCH 32.3 12/11/2021 0930  ? MCHC 32.5 12/11/2021 0930  ? RDW 18.0 (H) 12/11/2021 0930  ? RDW 15.0 06/10/2021 1448  ? LYMPHSABS 0.8 12/11/2021 0930  ? MONOABS 0.7 12/11/2021 0930  ? EOSABS 0.3 12/11/2021 0930  ? BASOSABS 0.1 12/11/2021 0930  ? ? ?  Latest Ref Rng & Units 12/11/2021  ?  9:30 AM 12/05/2021  ?  7:43 AM 06/18/2021  ?  8:02 AM  ?BMP  ?Glucose 70 - 99 mg/dL 82   129     ?BUN 8 - 23 mg/dL 20   26     ?Creatinine 0.44 - 1.00 mg/dL 0.80   0.89     ?Sodium 135 - 145 mmol/L 143   138   142    ? 143    ?Potassium 3.5 - 5.1 mmol/L 3.4   3.6   3.8    ? 3.7    ?Chloride 98 - 111 mmol/L 106   105     ?CO2 22 - 32 mmol/L 28   24     ?Calcium 8.9  - 10.3 mg/dL 9.0   8.5     ? ?Chest imaging: ?CTA Chest 12/05/21 ?Mediastinum/Nodes: No enlarged mediastinal, hilar, or axillary lymph ?nodes. Thyroid gland, trachea, and esophagus demonstrate no ?signific

## 2021-12-17 ENCOUNTER — Telehealth: Payer: Self-pay

## 2021-12-17 NOTE — Telephone Encounter (Signed)
Spoke with patient's son Marcello Moores and scheduled a mychart Palliative Consult for 12/20/21 @ 2:30PM. ? ?Consent obtained; updated Netsmart, Team List and Epic. ? ?

## 2021-12-19 DIAGNOSIS — F418 Other specified anxiety disorders: Secondary | ICD-10-CM | POA: Diagnosis not present

## 2021-12-20 ENCOUNTER — Telehealth: Payer: Medicare HMO | Admitting: Family Medicine

## 2021-12-20 ENCOUNTER — Encounter: Payer: Self-pay | Admitting: Family Medicine

## 2021-12-20 ENCOUNTER — Telehealth: Payer: Self-pay | Admitting: Cardiology

## 2021-12-20 ENCOUNTER — Other Ambulatory Visit: Payer: Self-pay

## 2021-12-20 DIAGNOSIS — I272 Pulmonary hypertension, unspecified: Secondary | ICD-10-CM

## 2021-12-20 DIAGNOSIS — Z515 Encounter for palliative care: Secondary | ICD-10-CM | POA: Diagnosis not present

## 2021-12-20 DIAGNOSIS — I350 Nonrheumatic aortic (valve) stenosis: Secondary | ICD-10-CM | POA: Diagnosis not present

## 2021-12-20 DIAGNOSIS — J9621 Acute and chronic respiratory failure with hypoxia: Secondary | ICD-10-CM | POA: Diagnosis not present

## 2021-12-20 MED ORDER — NITROGLYCERIN 0.4 MG SL SUBL
0.4000 mg | SUBLINGUAL_TABLET | SUBLINGUAL | 3 refills | Status: AC | PRN
Start: 2021-12-20 — End: ?

## 2021-12-20 NOTE — Progress Notes (Signed)
? ? ?Manufacturing engineer ?Community Palliative Care Consult Note ?Telephone: [phone]  ?Fax: [phone]  ? ?Date of encounter: 12/20/21 ?2:37 PM ?PATIENT NAME: Melissa Barron ?[place] [address]   ?[phone] (home)  ?DOB: 09-Aug-1931 ?MRN: [id number] ?PRIMARY CARE PROVIDER:    ?[name], MD,  ?[address] ?[place] [address] ?[phone] ? ?REFERRING PROVIDER:   ?[name], MD ?[address] ?[place],  [place] [address] ?[phone] ? ?RESPONSIBLE PARTY:    ?Contact Information   ? ? Name Relation Home Work Mobile  ? Melissa, Barron   [phone]  ? ?  ? ?I connected with  Melissa Barron and her son on 12/20/21 by a video enabled telemedicine application and verified that I am speaking with the correct person using two identifiers. ?  ?I discussed the limitations of evaluation and management by telemedicine. The patient expressed understanding and agreed to proceed.  ?Palliative Care was asked to follow this patient by consultation request of  Wile, Jesse Sans, MD to address advance care planning and complex medical decision making. This is the initial visit.  ? ? ?      ASSESSMENT, SYMPTOM MANAGEMENT AND PLAN / RECOMMENDATIONS:  ? Chronic Hypoxic respiratory failure on O2 secondary to   PAH/COPD ?Continue O2 @ 5L [place] supplied by [employer]. ?Maintain good oral hydration. ?Continue Stiolto respimat, Tyvaso and prn Albuterol ? ? Aortic stenosis ?Avoid large shifts in volume status. ? ? Unintentional weight loss ?Most likely due in large part to high energy consumption with difficulty breathing. ?10 lb weigh loss in 3 months ?Continue to encourage small frequent meals ?Has had low Albumin of 3.0 in past, currently at 3.4. ? ? Palliative Care Encounter ?Follow up visit to address goals of care/possible MOST form ?Review med regimen with prescribing providers and pt to simplify regimen without undue risk ? ? ?Follow up Palliative Care Visit: Palliative care will continue  to follow for complex medical decision making, advance care planning, and clarification of goals. Return 2 weeks or prn. ? ? ? ?This visit was coded based on medical decision making (MDM). ? ?PPS: 50% ? ?HOSPICE ELIGIBILITY/DIAGNOSIS: TBD ? ?Chief Complaint:  ?[facility] received a referral to follow up with patient for chronic disease management with chronic hypoxic respiratory failure due to COPD.  Pt also being seen for  advance directive  counseling and defining/refining goals of care. ? ?HISTORY OF PRESENT ILLNESS:  Melissa Barron is a 88 y.o. year old female who relocated from [place], [place] about a year and a half ago to be near her family and now lives in an independent living apartment at [facility]. Has COPD, PAH, CAD, HTN, aortic valve stenosis, chronic heart failure with preserved EF, acute on chronic respiratory failure with hypoxia, polycythemia vera, and positive ANA. She was referred to Palliative after seeing Pulmonologist who indicated he thought her life span might be roughly around 1 year, Cardiologist concurred on his visit with her.  She states cough is less productive than previous, with no worsening SOB, some DOE.  On 5 L [place] from [place].  Eating well, losing weight (101 lbs).  Less activity tolerance. Has infrequent CP with exertion and takes NTG which relieves pain, which she had twice this week.  Fell 4 weeks ago and cracked 2 ribs but no pain currently  No nausea or vomiting.  Unpredictable moods-down then has more energy since lost spouse.  Most of the time sleeps well. Whenever  she stands up and has minimal exertion her O2 sats drop in the 80s with SOB.  Son has approached PCP for orders for a small electric wc, stating she has long halls to go down to get to dining hall.  PCP put in orders. No coughing or choking after eating or drinking. Pt and son wanted to discuss simplification of her medication regimen since they are moving from a focus of aggressive  treatment to more comfort based care.  Pt has living will, son is [facility] POA and has DNR.  Son is unsure about MOST form. ? ?History obtained from review of EMR, discussion with primary team, and interview with family, facility staff/caregiver and/or Melissa Barron.  ?I reviewed available labs, medications, imaging, studies and related documents from the EMR.  Records reviewed and summarized above.  ? ?ROS ?General: NAD ?EYES: denies vision changes ?ENMT: denies dysphagia ?Cardiovascular: has infrequent intermittent chest pain responsive to NTG, Has DOE, wears O2 via [place] _0 /min ?Pulmonary: endorses slight cough, denies increased SOB unless exerting ?Abdomen: endorses good appetite but continued weight loss denies constipation, endorses continence of bowel ?GU: denies dysuria, endorses continence of urine ?MSK:  denies increased weakness, recent fall in last 4 weeks reported ?Skin: denies rashes or wounds ?Neurological: denies pain, denies insomnia ?Psych: Endorses mood unstable with ups and downs ?Heme/lymph/immuno: denies bruises, abnormal bleeding ? ?Physical Exam: ?Current and past weights: 101 lbs 3.2 ounces on 12/16/21, 111 lbs as of 09/25/21 ?Constitutional: NAD ?General: frail appearing, thin and pale ?EYES: anicteric sclera, lids intact, no discharge  ?ENMT: hard of hearing ?CV: Can speak in completed sentences without having to stop to breathe.  No visible cyanosis ?Pulmonary:  no audible increased work of breathing, wheezing or cough, O2 per [place] ?MSK: no sarcopenia in upper extremities, ambulatory ?Neuro: no noted cognitive impairment ?Psych: non-anxious affect, A and O x 3 ? ? ?CURRENT PROBLEM LIST:  ?Patient Active Problem List  ? Diagnosis Date Noted  ? Positive ANA (antinuclear antibody) 10/15/2021  ? PAH (pulmonary artery hypertension) ([place]) 07/18/2021  ? Respiratory failure ([place]) 06/18/2021  ? Pulmonary hypertension ([place])   ? Acute on chronic respiratory failure with hypoxia ([place]) 06/17/2021  ? Chronic  obstructive pulmonary disease ([place]) 06/17/2021  ? Chronic heart failure with preserved ejection fraction ([place]) 03/13/2021  ? Unstable angina ([place]) 12/27/2020  ? Exertional dyspnea 12/26/2020  ? Nonrheumatic aortic valve stenosis 12/26/2020  ? Polycythemia vera ([place]) 07/06/2020  ? Coronary artery disease 06/18/2020  ? Bruit of right carotid artery 06/18/2020  ? Family history of abdominal aortic aneurysm (AAA) 06/18/2020  ? Essential hypertension 06/18/2020  ? ?PAST MEDICAL HISTORY:  ?Active Ambulatory Problems  ?  Diagnosis Date Noted  ? Coronary artery disease 06/18/2020  ? Bruit of right carotid artery 06/18/2020  ? Family history of abdominal aortic aneurysm (AAA) 06/18/2020  ? Essential hypertension 06/18/2020  ? Polycythemia vera ([place]) 07/06/2020  ? Exertional dyspnea 12/26/2020  ? Nonrheumatic aortic valve stenosis 12/26/2020  ? Unstable angina ([place]) 12/27/2020  ? Chronic heart failure with preserved ejection fraction ([place]) 03/13/2021  ? Acute on chronic respiratory failure with hypoxia ([place]) 06/17/2021  ? Chronic obstructive pulmonary disease ([place]) 06/17/2021  ? Respiratory failure ([place]) 06/18/2021  ? Pulmonary hypertension ([place])   ? PAH (pulmonary artery hypertension) ([place]) 07/18/2021  ? Positive ANA (antinuclear antibody) 10/15/2021  ? ?Resolved Ambulatory Problems  ?  Diagnosis Date Noted  ? No Resolved Ambulatory Problems  ? ?Past Medical History:  ?Diagnosis Date  ? Emphysema,  unspecified ([place])   ? Heart attack ([place]) 2016  ? Hyperlipidemia   ? Hypertension   ? ?SOCIAL HX:  ?Social History  ? ?Tobacco Use  ? Smoking status: Former  ?  Packs/day: 1.50  ?  Years: 30.00  ?  Pack years: 45.00  ?  Types: Cigarettes  ?  Quit date: 21  ?  Years since quitting: 43.3  ? Smokeless tobacco: Never  ?Substance Use Topics  ? Alcohol use: Yes  ?  Alcohol/week: 1.0 standard drink  ?  Types: 1 Glasses of wine per week  ?  Comment: 7 days a week/1 a day  ? ?FAMILY HX:  ?Family History  ?Problem Relation Age of Onset  ?  Heart attack Mother   ? Parkinson's disease Sister   ? Emphysema Sister   ? Colon cancer Brother   ?   ? ? ?Preferred Pharmacy: ?ALLERGIES:  ?Allergies  ?Allergen Reactions  ? Latex Itching and Rash  ?   ?  ?

## 2021-12-20 NOTE — Telephone Encounter (Signed)
Pt needs refill on below meds  ? ?nitroGLYCERIN (NITROSTAT) 0.4 MG SL tablet ? ?[id number] [place]

## 2021-12-20 NOTE — Telephone Encounter (Signed)
Refill has been sent.

## 2021-12-24 ENCOUNTER — Encounter: Payer: Self-pay | Admitting: Pulmonary Disease

## 2022-01-03 ENCOUNTER — Other Ambulatory Visit: Payer: Self-pay | Admitting: Cardiology

## 2022-01-06 ENCOUNTER — Telehealth: Payer: Self-pay | Admitting: Pulmonary Disease

## 2022-01-06 NOTE — Telephone Encounter (Signed)
Patient's son called and states they have not heard from [employer] regarding motorized wheelchair. States they can go through [employer], but they will need an order form filled out. Spoke with Andee Poles at [employer] and she will send the order to the specialized team for motorized orders. Spoke with [name] and informed of this information. Tom verbalized understanding.  ?

## 2022-01-08 ENCOUNTER — Encounter: Payer: Self-pay | Admitting: Family Medicine

## 2022-01-08 ENCOUNTER — Other Ambulatory Visit: Payer: Self-pay

## 2022-01-08 ENCOUNTER — Other Ambulatory Visit: Payer: Medicare HMO | Admitting: Family Medicine

## 2022-01-08 DIAGNOSIS — J9621 Acute and chronic respiratory failure with hypoxia: Secondary | ICD-10-CM

## 2022-01-08 DIAGNOSIS — I2721 Secondary pulmonary arterial hypertension: Secondary | ICD-10-CM | POA: Diagnosis not present

## 2022-01-08 DIAGNOSIS — F4321 Adjustment disorder with depressed mood: Secondary | ICD-10-CM | POA: Insufficient documentation

## 2022-01-08 DIAGNOSIS — J449 Chronic obstructive pulmonary disease, unspecified: Secondary | ICD-10-CM | POA: Diagnosis not present

## 2022-01-08 DIAGNOSIS — I272 Pulmonary hypertension, unspecified: Secondary | ICD-10-CM

## 2022-01-08 DIAGNOSIS — J8410 Pulmonary fibrosis, unspecified: Secondary | ICD-10-CM

## 2022-01-08 DIAGNOSIS — I25118 Atherosclerotic heart disease of native coronary artery with other forms of angina pectoris: Secondary | ICD-10-CM

## 2022-01-08 DIAGNOSIS — E43 Unspecified severe protein-calorie malnutrition: Secondary | ICD-10-CM | POA: Diagnosis not present

## 2022-01-08 DIAGNOSIS — I10 Essential (primary) hypertension: Secondary | ICD-10-CM

## 2022-01-08 MED ORDER — BISOPROLOL FUMARATE 5 MG PO TABS: 5.0000 mg | ORAL_TABLET | Freq: Every day | ORAL | 3 refills | Status: DC

## 2022-01-08 NOTE — Progress Notes (Signed)
? ? ?Manufacturing engineer ?Community Palliative Care Consult Note ?Telephone: [phone]  ?Fax: [phone]  ? ? ?Date of encounter: 01/08/22 ?4:24 PM ?PATIENT NAME: [place] [address]   ?[phone] (home)  ?DOB: 05/29/1931 ?MRN: [id number] ?PRIMARY CARE PROVIDER:    ?[name], MD,  ?[address] ?[place] [address] ?[phone] ? ?REFERRING PROVIDER:   ?[name], MD ?[address] ?[place],  [place] [address] ?[phone] ? ?RESPONSIBLE PARTY:    ?Contact Information   ? ? Name Relation Home Work Mobile  ? RUFINA, KIMERY   [phone]  ? ?  ? ? ? ?I met face to face with patient and son in her independent living facility. Palliative Care was asked to follow this patient by consultation request of  Wile, Jesse Sans, MD to address advance care planning and complex medical decision making. This is a follow up visit. ? ?                                 ASSESSMENT AND PLAN / RECOMMENDATIONS:  ?Acute on chronic respiratory failure due to Pulmonary Fibrosis, COPD and pulmonary hypertension ?Has stopped Tyvaso ?Continue O2 at 5 L/min ?Discuss with cardiologist referral to hospice. ?Encouraged with increased anxiety to use her 0.25 mg Xanax every 6 hours as needed ? ?2.  Complicated grief ?Provided [facility] mental health crisis hotline ?Encouraged follow-up with priest as scheduled ?Social work referral for counseling ?Grief related to multiple losses and known terminal illness ?Continue Zoloft 100 mg daily as prescribed ? ?3.  Severe protein calorie malnutrition ?10% weight loss over 5 months, suspected accelerated weight loss in the last month ?Social work to address potential resources for food access and in-home private pay assistance ?Encourage addition of protein supplement like Ensure ? ? ? ?Follow up Palliative Care Visit: Palliative care will continue to follow for complex medical decision making, advance care planning, and clarification  of goals of care if pt not accepted onto Hospice Service. ? ? ?This portion of the visit was coded based on medical decision making (MDM). ? ?PPS: 30% ? ?HOSPICE ELIGIBILITY/DIAGNOSIS: ?Chronic hypoxic respiratory failure secondary to COPD and pulmonary hypertension complicated by moderate aortic stenosis. ? ?Chief Complaint:  ?Palliative Care is making an acute visit to assess dyspnea, de-escalate anxiety and discuss goals of care. ? ?HISTORY OF PRESENT ILLNESS:  Melissa Barron is a 88 y.o. year old female with chronic hypoxic respiratory failure secondary to COPD and pulmonary hypertension.  She also has CAD, moderate aortic valve stenosis with moderate aortic regurgitation, chronic heart failure with preserved ejection fraction, hypertension, hyperlipidemia, polycythemia vera and a positive ANA.  Received call from patient's son indicating that patient was refusing to wear her oxygen and insisting that she be moved immediately to hospice stating she was ready to go.  He indicated that he was having trouble getting through to her given her extreme emotional state and needed some assistance.  On arrival patient was noted to be cyanotic, visibly dyspneic and tachypneic.  She was emotionally distraught and "feeling like a burden to my sons."  She indicates that she is unable to make it down a long haul to get her meals, at times she has no energy and is too short of breath to get out of bed, do anything around her house or walk her dog.  Recently patient lost her husband who is under  hospice care and they had been married for 66 years.  She states she has signs and cannot burden their spouses to spend time with her or take care of her needs and understands that their families have to be priority for them but feels like she is missing social contact with women who can understand her emotional position.  She has been taking her Zoloft 100 mg daily and has requested for the priest to see her tomorrow.  Was able to calm  patient and get her to reapply her oxygen to demonstrate the improvement in her oxygenation status and mood.  Explained that although the oxygen would not reverse her disease it would make her more comfortable.  Advised that she is not in a state that I would expect her life expectancy to be 2 weeks or less needing symptom management which would make her eligible for admission to hospice home.  Discussed options to address her immediate needs such as eating, self-care and light household chores.  Son and families have agreed to purchase a electric wheelchair but patient has refused.  She is insistent that she cannot afford to move to assisted living.  Advised patient that although there are options for some help there will not be anyone living in with her unless that is private pay.  Son indicates that he has been asking to get her laundry done and she has refused and then said that she had to ask.  Advised son to take her laundry without asking but letting her know that he would return it when done.  Discussed options for having meals delivered and possible help through hospice services if she is eligible.  Son requested that I contact her cardiologist Dr. Charolette Child as they are interested in pursuing hospice.  He indicated that Dr. Charolette Child had told her previously that she was approaching end-of-life and patient had indicated at that time she was not ready for hospice.   ?Pt last had echo 10/2021 with preserved EF 59%, moderate aortic stenosis and regurgitation, mild tricuspid regurgitation with PASP 34 improved from 50s on Tyvaso.  Pt is no longer taking Tyvaso per son. Pt states she is "ready to go and don't know why God is keeping me here. I'm done."  She is not eating much, per self report sleeping longer, unable to walk her dog or consistently get meals and does not cook for herself, has increased fatigue, dyspnea. Initial O2 sat 79% on room air, improved after 5-10 minutes to 92% on 5L but pt does not  subjectively think oxygen is helping. ?Per notes from most recent visit with Pulmonologist, pt had CT findings concerning for pulmonary fibrosis with optmized treatment and improvement in [place] pressures on Opsumat and Tyvaso. ? ?History obtained from review of EMR, interview with son [name], and/or Ms. Dulac.  ?I reviewed available labs, medications, imaging, studies and related documents from the EMR.  Records reviewed and summarized above.  ? ?ROS ?General: Endorses fatigue ?EYES: denies vision changes ?ENMT: denies dysphagia ?Cardiovascular: denies chest pain, endorses DOE and decreased activity tolerance ?Pulmonary: denies cough, endorses increased SOB even on O2  ?Abdomen: endorses poor appetite, denies constipation, endorses occasional incontinence of bowel ?GU: denies dysuria, endorses occasional incontinence of urine ?MSK:  endorses increased weakness,  no falls reported ?Skin: denies rashes or wounds ?Neurological: denies pain, denies insomnia ?Psych: Endorses depressed mood and anxiety ?Heme/lymph/immuno: denies bruises, abnormal bleeding ? ?Physical Exam: ?Current and past weights:  07/24/21 weight 111 lbs 3.2 ounces, last weight  12/16/21 was 101 lbs 3.2 ounces ?Constitutional: Emotionally distraught with increased work of breathing ?General: frail appearing ?EYES: anicteric sclera, lids intact, no discharge  ?ENMT: Hard of hearing, oral mucous membranes moist, dentition intact ?CV: S1S2, IRIR, no LE edema, noted cyanosis of hands/feet/lips (did not have O2 on) which improved with increase in O2 ?Pulmonary: Bibasilar coarse pleural rub, RUL clear, mild increased work of breathing, no cough, room air ?Abdomen: normo-active BS + 4 quadrants, soft and non tender, no ascites ?GU: deferred ?MSK: noted sarcopenia, prominent ribs/vertebrae with loss of muscle mass and subcutaneous fat,  moves all extremities, minimally ambulatory ?Skin: warm and dry, no rashes or wounds on visible skin ?Neuro:  no generalized  weakness, no cognitive impairment ?Psych:  Depressed with anxiety, tearful, A and O x 3 ?Hem/lymph/immuno: no widespread bruising ? ? ? ?___________________________________________________________________________

## 2022-01-09 ENCOUNTER — Telehealth: Payer: Self-pay | Admitting: Family Medicine

## 2022-01-09 ENCOUNTER — Encounter: Payer: Self-pay | Admitting: Family Medicine

## 2022-01-09 DIAGNOSIS — E43 Unspecified severe protein-calorie malnutrition: Secondary | ICD-10-CM | POA: Insufficient documentation

## 2022-01-09 NOTE — Telephone Encounter (Signed)
Received notification from Dr Bufford Buttner, Cardiologist in [place] that he agreed on her current course the patient's life expectancy is 6 months or less.  He asked for Hospice to serve as attending physician but offered he would remain if needed and he would make referral for Hospice to [facility].  Current telephone call was to make son aware as promised of the decision regarding pt's eligibility for hospice services.  He states pt has been scheduled for Saturday morning for Hospice intake.  He states that pt is having another bad day and has been very distraught saying that he doesn't spend time with her as her companion that he is a glorified errand boy and demanding that he give her [employer] and a bottle of wine.  He states he refused to take her wine and told her he had to go.  He was appreciative of response to get pt admitted and is aware that Hospice MD will likely serve as attending and had no issue with it. ?Damaris Hippo FNP-C ?

## 2022-01-15 ENCOUNTER — Telehealth: Payer: Self-pay | Admitting: Pulmonary Disease

## 2022-01-15 NOTE — Telephone Encounter (Signed)
Mel Almond I would say yes. Melissa needs to be made aware when these things are happening.   ?

## 2022-01-15 NOTE — Telephone Encounter (Signed)
Spoke with Santiago Glad at [employer]. Santiago Glad confirmed patient and the ordered that was received on 12/16/2021. Santiago Glad states that the specialzed team that handles motorized wheelchairs will reach out to the patient today. Informed patient's son of this information and will call again this afternoon to confirm they are working on this.  ? ?PCC's should we reach out to [facility] regarding this? It has been a month and I have now called twice and the patient has called once. ?

## 2022-01-15 NOTE — Telephone Encounter (Signed)
Attempted to call Melissa Barron- phone dropped. Called [employer] again to check on status- no note that patient was contacted. Will attempt again later ?

## 2022-01-15 NOTE — Telephone Encounter (Signed)
The patient's son called and stated they have not heard from [employer] regarding motorized wheelchair. He states that he called [employer] to inquire about the wheelchair, and he was told that they have no record of his mom. [name] states that they can go through [employer], but they will need an order form filled out  ?

## 2022-01-16 NOTE — Progress Notes (Incomplete)
Patient Care Team: [name], MD as PCP - General (Internal Medicine)  DIAGNOSIS: No diagnosis found.  SUMMARY OF ONCOLOGIC HISTORY: Oncology History   No history exists.    CHIEF COMPLIANT:  Follow-up of polycythemia vera  INTERVAL HISTORY: Melissa Barron is a 88 y.o. with above-mentioned history of polycythemia vera. She presents to the clinic today for a follow-up    ALLERGIES:  is allergic to latex.  MEDICATIONS:  Current Outpatient Medications  Medication Sig Dispense Refill   ALPRAZolam (XANAX) 0.25 MG tablet Take 0.25 mg by mouth as needed.     amLODipine (NORVASC) 10 MG tablet Take 1 tablet (10 mg total) by mouth daily. 90 tablet 3   aspirin EC 81 MG tablet Take 1 tablet (81 mg total) by mouth daily. Swallow whole. 90 tablet 3   atorvastatin (LIPITOR) 40 MG tablet Take 1 tablet (40 mg total) by mouth daily. 90 tablet 3   bisoprolol (ZEBETA) 5 MG tablet Take 1 tablet (5 mg total) by mouth daily. 90 tablet 3   Cholecalciferol (VITAMIN D3) 250 MCG (10000 UT) capsule Take 1 capsule (10,000 Units total) by mouth daily.     Ferrous Sulfate (IRON) 325 (65 Fe) MG TABS      furosemide (LASIX) 20 MG tablet TAKE 1 TABLET EVERY DAY 90 tablet 1   furosemide (LASIX) 40 MG tablet Take 0.5 tablets (20 mg total) by mouth 2 (two) times daily. 120 tablet 3   HYDROcodone bit-homatropine (HYCODAN) 5-1.5 MG/5ML syrup Take 5 mLs by mouth every 6 (six) hours as needed for cough. 240 mL 0   hydroxyurea (HYDREA) 500 MG capsule Take 1 capsule (500 mg total) by mouth daily. 90 capsule 3   irbesartan (AVAPRO) 300 MG tablet Take 1 tablet (300 mg total) by mouth at bedtime. 90 tablet 3   isosorbide mononitrate (IMDUR) 30 MG 24 hr tablet TAKE 1 TABLET (30 MG TOTAL) BY MOUTH DAILY. 90 tablet 1   KLOR-CON M20 20 MEQ tablet TAKE 1 TABLET BY MOUTH EVERY DAY 90 tablet 1   macitentan (OPSUMIT) 10 MG tablet Take 1 tablet (10 mg total) by mouth daily. 90 tablet 2   nitroGLYCERIN (NITROSTAT) 0.4 MG SL tablet  Place 1 tablet (0.4 mg total) under the tongue every 5 (five) minutes x 3 doses as needed for chest pain. 25 tablet 3   nystatin ointment (MYCOSTATIN) Apply 1 application. topically 2 (two) times daily as needed (rash).     pantoprazole (PROTONIX) 40 MG tablet TAKE 1 TABLET (40 MG TOTAL) BY MOUTH DAILY. 90 tablet 3   sertraline (ZOLOFT) 100 MG tablet Take 100 mg by mouth daily.     Tiotropium Bromide-Olodaterol (STIOLTO RESPIMAT) 2.5-2.5 MCG/ACT AERS INHALE 2 PUFFS INTO THE LUNGS DAILY. 12 g 3   Treprostinil (TYVASO REFILL) 0.6 MG/ML SOLN Inhale 18 mcg into the lungs 4 (four) times daily. Titrate up by 3 inhalations every 1-2 weeks as tolerated to goal inhalation of 12 per dose. 2.9 mL 11   No current facility-administered medications for this visit.    PHYSICAL EXAMINATION: ECOG PERFORMANCE STATUS: {CHL ONC ECOG PS:[id number]}  There were no vitals filed for this visit. There were no vitals filed for this visit.  BREAST:*** No palpable masses or nodules in either right or left breasts. No palpable axillary supraclavicular or infraclavicular adenopathy no breast tenderness or nipple discharge. (exam performed in the presence of a chaperone)  LABORATORY DATA:  I have reviewed the data as listed    Latest  Ref Rng & Units 12/11/2021    9:30 AM 12/05/2021    7:43 AM 06/18/2021    8:02 AM  CMP  Glucose 70 - 99 mg/dL 82   129     BUN 8 - 23 mg/dL 20   26     Creatinine 0.44 - 1.00 mg/dL 0.80   0.89     Sodium 135 - 145 mmol/L 143   138   142     143    Potassium 3.5 - 5.1 mmol/L 3.4   3.6   3.8     3.7    Chloride 98 - 111 mmol/L 106   105     CO2 22 - 32 mmol/L 28   24     Calcium 8.9 - 10.3 mg/dL 9.0   8.5     Total Protein 6.5 - 8.1 g/dL 6.6   6.0     Total Bilirubin 0.3 - 1.2 mg/dL 1.1   0.7     Alkaline Phos 38 - 126 U/L 51   43     AST 15 - 41 U/L 15   13     ALT 0 - 44 U/L 12   9       Lab Results  Component Value Date   WBC 9.8 12/11/2021   HGB 13.3 12/11/2021   HCT 40.9  12/11/2021   MCV 99.3 12/11/2021   PLT 334 12/11/2021   NEUTROABS 7.9 (H) 12/11/2021    ASSESSMENT & PLAN:  No problem-specific Assessment & Plan notes found for this encounter.    No orders of the defined types were placed in this encounter.  The patient has a good understanding of the overall plan. she agrees with it. she will call with any problems that may develop before the next visit here. Total time spent: 30 mins including face to face time and time spent for planning, charting and co-ordination of care   Suzzette Righter, [place] 01/16/22    I Gardiner Coins am scribing for Dr. Lindi Adie  ***

## 2022-01-16 NOTE — Telephone Encounter (Signed)
Reached out to Andee Poles again today and she states specialized team did speak with patient's son, [name], yesterday.   Nothing further needed.

## 2022-01-23 ENCOUNTER — Inpatient Hospital Stay: Payer: Medicare HMO

## 2022-01-23 ENCOUNTER — Inpatient Hospital Stay: Payer: Medicare HMO | Admitting: Hematology and Oncology

## 2022-01-23 NOTE — Assessment & Plan Note (Signed)
Diagnosed at [facility] Current treatment: Hydroxyurea 500 mg daily History of COPD, depression, hypertension, hypercholesterolemia, osteoporosis, GERD, CAD Hydroxyurea toxicities:None: She is tolerating it extremely well.  Lab review: 05/11/2020: Hemoglobin 14.3, WBC 7.9, MCV 106.8, platelets 260 01/03/2021:Hemoglobin 13.7, WBC 7.3, MCV 103.9, platelets 254 (Hydrea 5 days a week) 06/17/2021: Hemoglobin 15.3, WBC 14.3, platelets 639 07/24/2021: Hemoglobin 14.2, platelets 636, WBC 13.9  (Hydrea 7 days a week) 01/23/2022:  Return to clinic in 6 months with labs and follow-up

## 2022-02-23 ENCOUNTER — Other Ambulatory Visit: Payer: Self-pay | Admitting: Hematology and Oncology

## 2022-03-05 ENCOUNTER — Other Ambulatory Visit: Payer: Self-pay | Admitting: Cardiology

## 2022-03-05 DIAGNOSIS — I10 Essential (primary) hypertension: Secondary | ICD-10-CM

## 2022-03-05 DIAGNOSIS — I25118 Atherosclerotic heart disease of native coronary artery with other forms of angina pectoris: Secondary | ICD-10-CM

## 2022-03-24 DIAGNOSIS — J9611 Chronic respiratory failure with hypoxia: Secondary | ICD-10-CM | POA: Diagnosis not present

## 2022-03-24 DIAGNOSIS — Z9981 Dependence on supplemental oxygen: Secondary | ICD-10-CM | POA: Diagnosis not present

## 2022-03-24 DIAGNOSIS — H04123 Dry eye syndrome of bilateral lacrimal glands: Secondary | ICD-10-CM | POA: Diagnosis not present

## 2022-03-24 DIAGNOSIS — R682 Dry mouth, unspecified: Secondary | ICD-10-CM | POA: Diagnosis not present

## 2022-03-24 DIAGNOSIS — J849 Interstitial pulmonary disease, unspecified: Secondary | ICD-10-CM | POA: Diagnosis not present

## 2022-05-28 ENCOUNTER — Other Ambulatory Visit (HOSPITAL_BASED_OUTPATIENT_CLINIC_OR_DEPARTMENT_OTHER): Payer: Self-pay

## 2022-05-28 MED ORDER — INFLUENZA VAC A&B SA ADJ QUAD 0.5 ML IM PRSY
PREFILLED_SYRINGE | INTRAMUSCULAR | 0 refills | Status: AC
  Filled 2022-05-28 (×2): qty 0.5, 1d supply, fill #0

## 2022-07-17 NOTE — Telephone Encounter (Signed)
error

## 2022-08-08 ENCOUNTER — Telehealth: Payer: Self-pay

## 2022-08-08 NOTE — Telephone Encounter (Signed)
[facility] called for refill request: TYVASO  Ok to refill? Please advise.   [phone] Option 2, then option 1 for pharmacist

## 2022-08-08 NOTE — Telephone Encounter (Signed)
Please check if the patient is still taking it. I have not been able to reach the patient on her or her son's phone.  Thanks MJP

## 2022-08-08 NOTE — Telephone Encounter (Signed)
Son said yes she is.

## 2022-08-11 ENCOUNTER — Other Ambulatory Visit: Payer: Self-pay | Admitting: Cardiology

## 2022-08-11 DIAGNOSIS — I2721 Secondary pulmonary arterial hypertension: Secondary | ICD-10-CM

## 2022-08-12 ENCOUNTER — Other Ambulatory Visit: Payer: Self-pay

## 2022-08-12 DIAGNOSIS — I2721 Secondary pulmonary arterial hypertension: Secondary | ICD-10-CM

## 2022-08-12 NOTE — Telephone Encounter (Signed)
Please send refill of Tyvaso at same dose.  Thanks MJP

## 2022-08-12 NOTE — Telephone Encounter (Signed)
Verbal given to pharmacy.

## 2022-08-19 DIAGNOSIS — Z9981 Dependence on supplemental oxygen: Secondary | ICD-10-CM | POA: Diagnosis not present

## 2022-08-19 DIAGNOSIS — R040 Epistaxis: Secondary | ICD-10-CM | POA: Diagnosis not present

## 2022-08-19 DIAGNOSIS — J3489 Other specified disorders of nose and nasal sinuses: Secondary | ICD-10-CM | POA: Diagnosis not present

## 2022-08-19 DIAGNOSIS — H903 Sensorineural hearing loss, bilateral: Secondary | ICD-10-CM | POA: Diagnosis not present

## 2022-09-12 ENCOUNTER — Other Ambulatory Visit: Payer: Self-pay

## 2022-09-12 DIAGNOSIS — I2721 Secondary pulmonary arterial hypertension: Secondary | ICD-10-CM

## 2022-09-12 MED ORDER — TYVASO REFILL KIT 0.6 MG/ML IN SOLN: 18.0000 ug | Freq: Four times a day (QID) | RESPIRATORY_TRACT | 11 refills | Status: AC

## 2022-09-19 ENCOUNTER — Telehealth: Payer: Self-pay | Admitting: Cardiology

## 2022-09-19 NOTE — Telephone Encounter (Signed)
Melissa Barron with [employer] called in regards to patient. Wanting to be sure they can get a death certificate for cremation signed for this patient, preferably next week. Her phone number is [phone].

## 2022-10-02 DEATH — deceased

## 2023-02-10 IMAGING — CR DG CHEST 2V
2 series · 2 of 2 positions shown · non-contrast
Comparison: None.

CLINICAL DATA: Shortness of breath

EXAM:
CHEST - 2 VIEW

[w chest pa *]
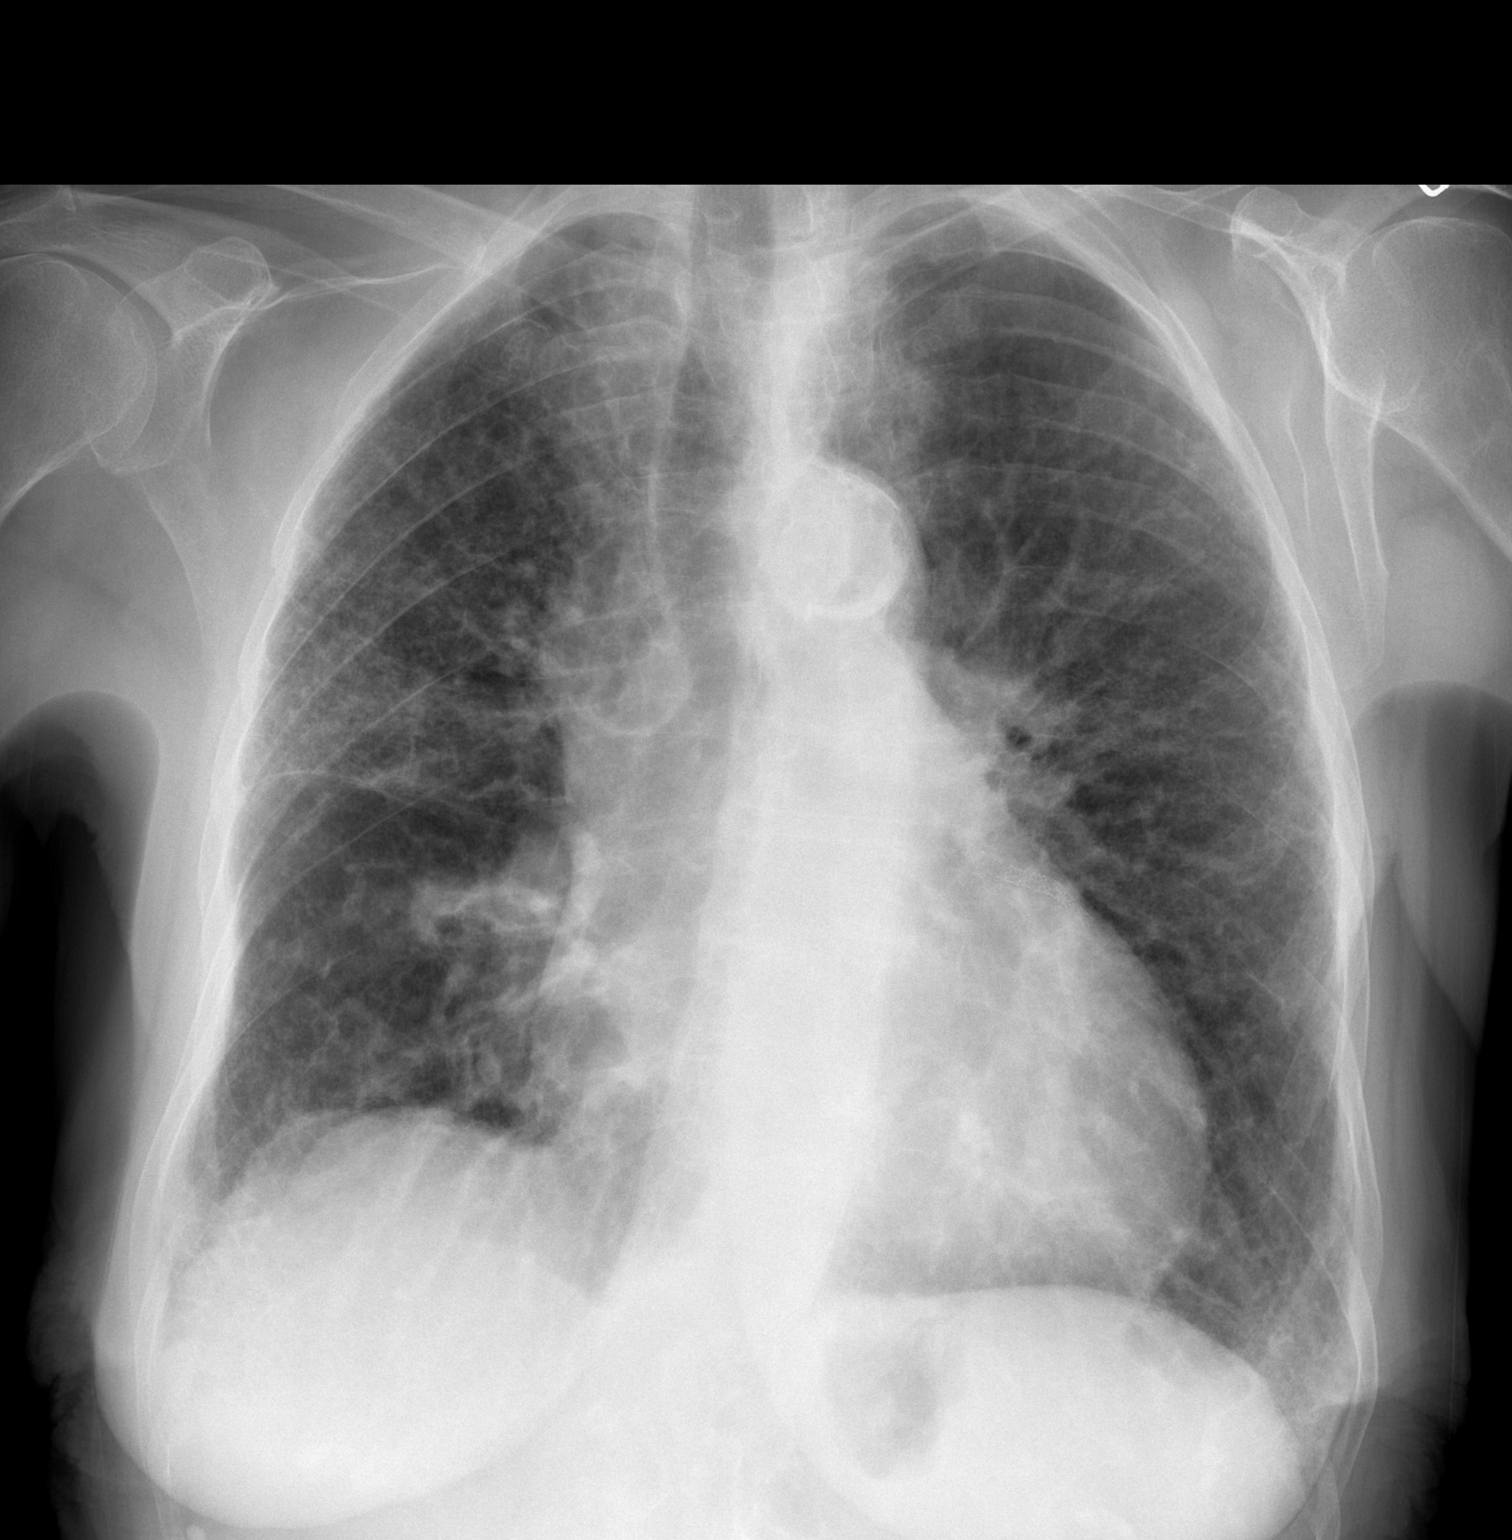

[w chest lat]
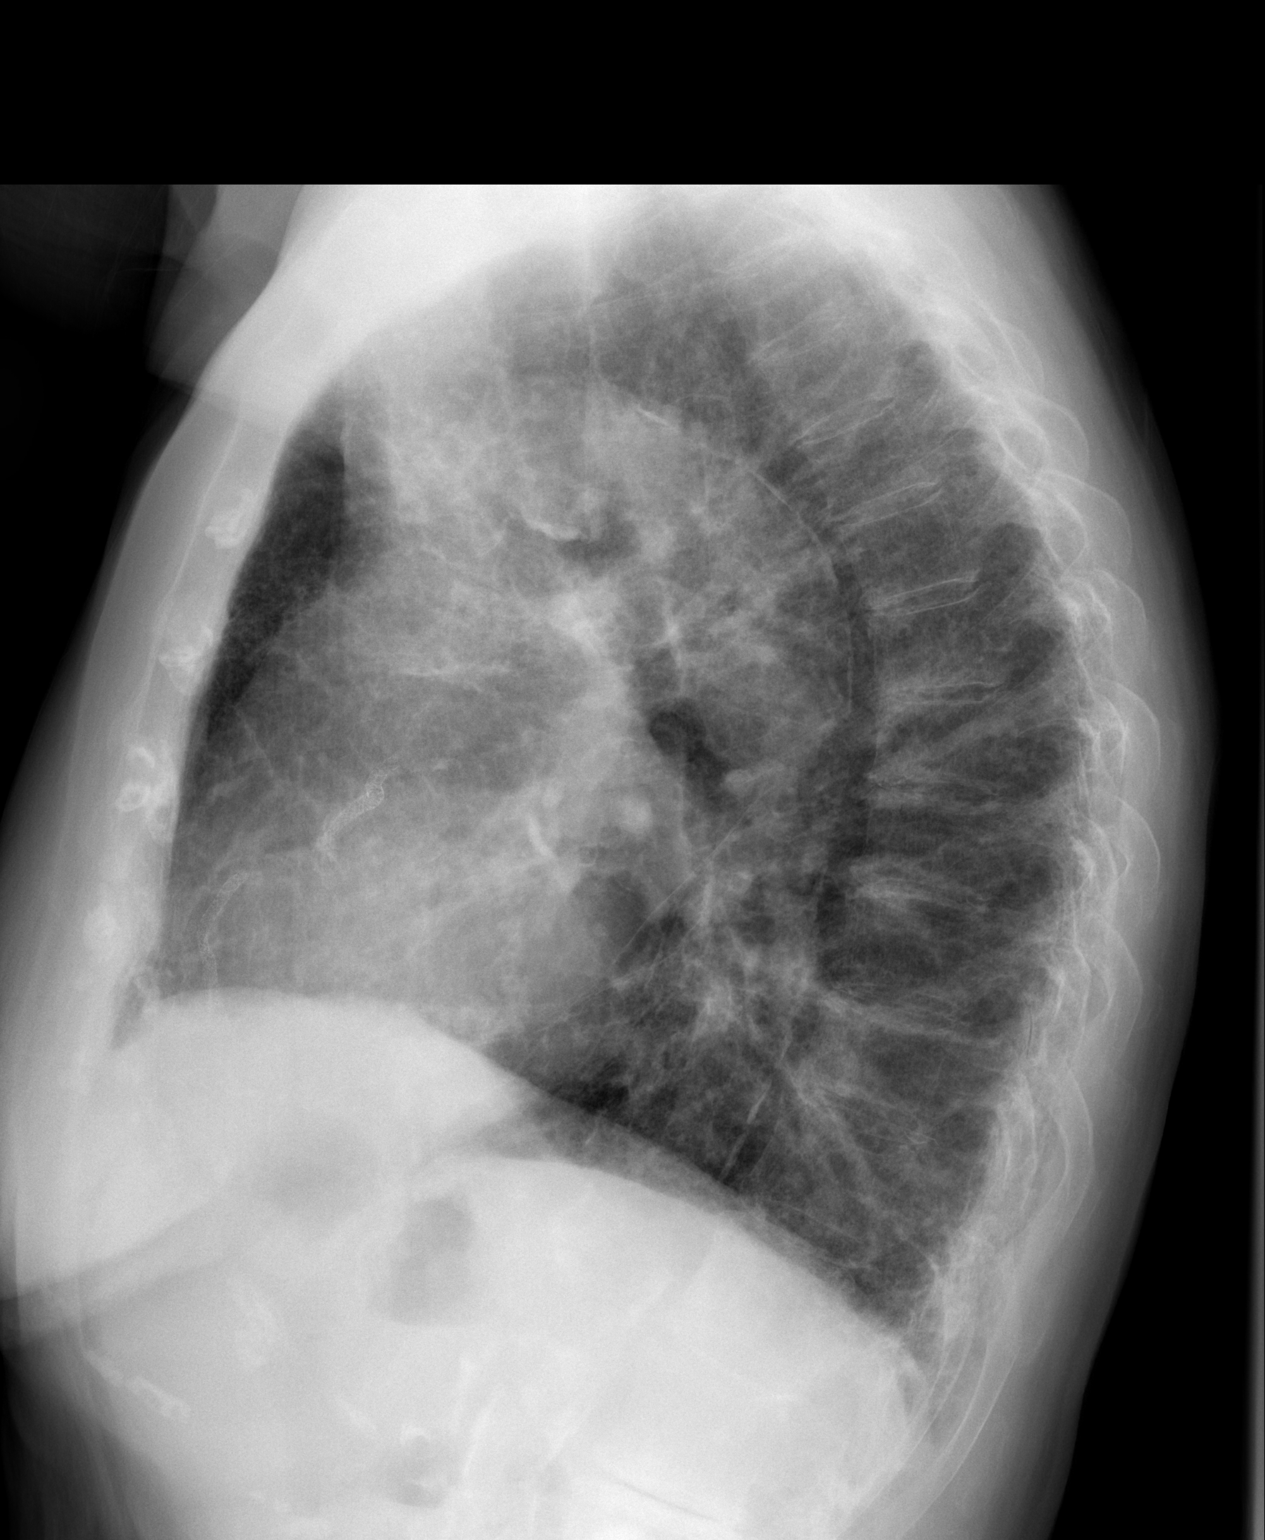

[2 of 2 positions shown; findings below may reference images not displayed]

FINDINGS: Unchanged, enlarged cardiac silhouette and aortic calcifications.
There are chronic interstitial lung changes, with prominent disease
in the right upper lobe, similar to prior exam. No large pleural
effusion or visible pneumothorax. Thoracic spondylosis. No acute
osseous abnormality.
IMPRESSION: Chronic interstitial lung disease with prominent disease in the
right upper lobe, similar to prior exam.

## 2023-12-07 IMAGING — DX DG CHEST 1V PORT
1 series · 1 of 1 positions shown · non-contrast
Comparison: 09/10/2021

CLINICAL DATA: Shortness of breath

EXAM:
PORTABLE CHEST 1 VIEW

[chest ap]
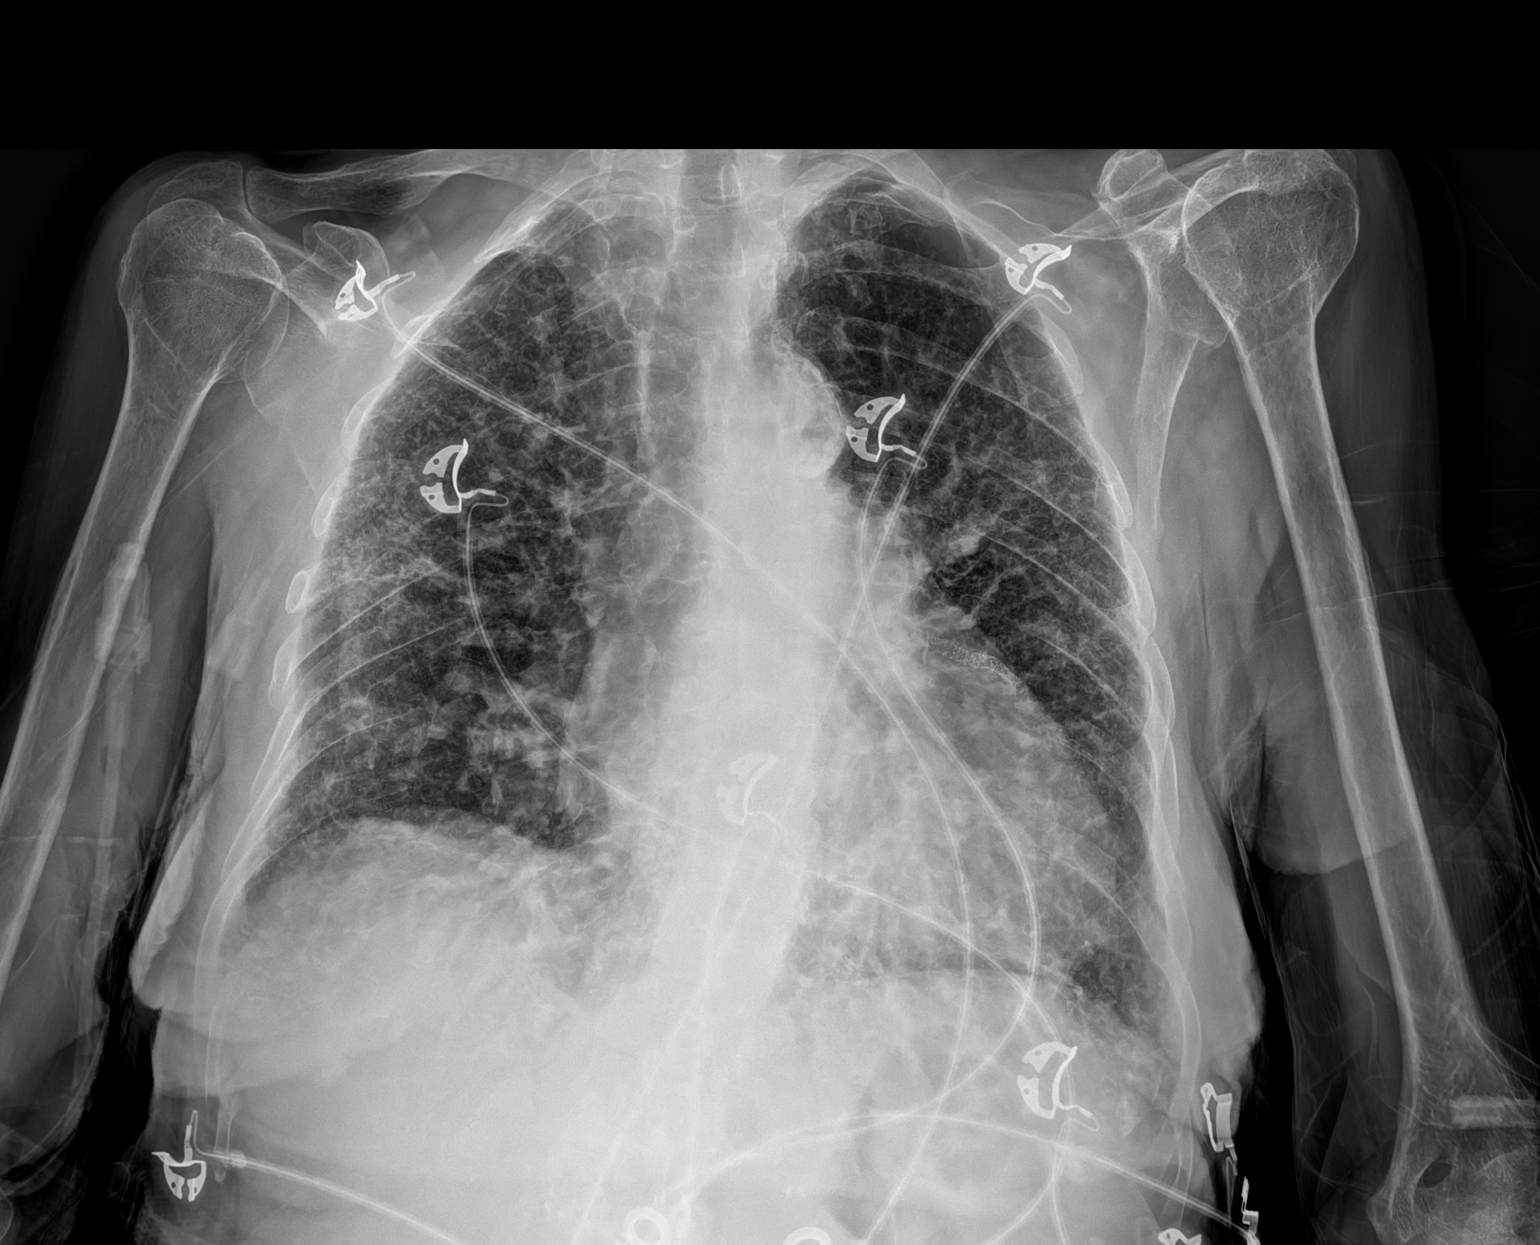

[1 of 1 positions shown; findings below may reference images not displayed]

FINDINGS: Emphysema with chronic interstitial changes. No new consolidation or
edema. No pleural effusion or pneumothorax. Stable cardiomediastinal
contours.
IMPRESSION: No acute process in the chest. Emphysema with chronic interstitial
changes.
